# Patient Record
Sex: Female | Born: 1955
Health system: Southern US, Community
[De-identification: ages and names within clinical notes are randomized; demographics above are authoritative.]

## PROBLEM LIST (undated history)

## (undated) HISTORY — PX: CARPAL TUNNEL RELEASE: SHX101

## (undated) HISTORY — PX: OTHER SURGICAL HISTORY: SHX169

---

## 1988-01-20 HISTORY — PX: TUBAL LIGATION: SHX77

## 1998-01-10 ENCOUNTER — Other Ambulatory Visit: Admission: RE | Admit: 1998-01-10 | Discharge: 1998-01-10 | Payer: Self-pay | Admitting: Obstetrics and Gynecology

## 1999-04-11 ENCOUNTER — Other Ambulatory Visit: Admission: RE | Admit: 1999-04-11 | Discharge: 1999-04-11 | Payer: Self-pay | Admitting: Obstetrics and Gynecology

## 2000-06-18 ENCOUNTER — Other Ambulatory Visit: Admission: RE | Admit: 2000-06-18 | Discharge: 2000-06-18 | Payer: Self-pay | Admitting: Obstetrics and Gynecology

## 2000-07-12 ENCOUNTER — Encounter: Payer: Self-pay | Admitting: General Surgery

## 2000-07-12 ENCOUNTER — Ambulatory Visit (HOSPITAL_COMMUNITY): Admission: RE | Admit: 2000-07-12 | Discharge: 2000-07-12 | Payer: Self-pay | Admitting: General Surgery

## 2000-12-25 ENCOUNTER — Encounter: Payer: Self-pay | Admitting: Internal Medicine

## 2000-12-25 ENCOUNTER — Emergency Department (HOSPITAL_COMMUNITY): Admission: EM | Admit: 2000-12-25 | Discharge: 2000-12-25 | Payer: Self-pay | Admitting: Internal Medicine

## 2001-07-29 ENCOUNTER — Other Ambulatory Visit: Admission: RE | Admit: 2001-07-29 | Discharge: 2001-07-29 | Payer: Self-pay | Admitting: Obstetrics and Gynecology

## 2002-09-08 ENCOUNTER — Ambulatory Visit (HOSPITAL_COMMUNITY): Admission: RE | Admit: 2002-09-08 | Discharge: 2002-09-08 | Payer: Self-pay | Admitting: Obstetrics and Gynecology

## 2002-09-08 ENCOUNTER — Encounter: Payer: Self-pay | Admitting: Obstetrics and Gynecology

## 2002-09-15 ENCOUNTER — Other Ambulatory Visit: Admission: RE | Admit: 2002-09-15 | Discharge: 2002-09-15 | Payer: Self-pay | Admitting: Obstetrics and Gynecology

## 2003-08-21 ENCOUNTER — Emergency Department (HOSPITAL_COMMUNITY): Admission: EM | Admit: 2003-08-21 | Discharge: 2003-08-21 | Payer: Self-pay | Admitting: Emergency Medicine

## 2004-07-04 ENCOUNTER — Ambulatory Visit (HOSPITAL_COMMUNITY): Admission: RE | Admit: 2004-07-04 | Discharge: 2004-07-04 | Payer: Self-pay | Admitting: Obstetrics and Gynecology

## 2005-08-19 ENCOUNTER — Ambulatory Visit (HOSPITAL_COMMUNITY): Admission: RE | Admit: 2005-08-19 | Discharge: 2005-08-19 | Payer: Self-pay | Admitting: Obstetrics and Gynecology

## 2005-08-27 ENCOUNTER — Other Ambulatory Visit: Admission: RE | Admit: 2005-08-27 | Discharge: 2005-08-27 | Payer: Self-pay | Admitting: Obstetrics & Gynecology

## 2006-07-01 ENCOUNTER — Ambulatory Visit (HOSPITAL_BASED_OUTPATIENT_CLINIC_OR_DEPARTMENT_OTHER): Admission: RE | Admit: 2006-07-01 | Discharge: 2006-07-01 | Payer: Self-pay | Admitting: *Deleted

## 2006-10-22 ENCOUNTER — Ambulatory Visit (HOSPITAL_COMMUNITY): Admission: RE | Admit: 2006-10-22 | Discharge: 2006-10-22 | Payer: Self-pay | Admitting: Obstetrics and Gynecology

## 2008-02-17 ENCOUNTER — Ambulatory Visit (HOSPITAL_COMMUNITY): Admission: RE | Admit: 2008-02-17 | Discharge: 2008-02-17 | Payer: Self-pay | Admitting: Obstetrics and Gynecology

## 2008-03-09 ENCOUNTER — Other Ambulatory Visit: Admission: RE | Admit: 2008-03-09 | Discharge: 2008-03-09 | Payer: Self-pay | Admitting: Obstetrics and Gynecology

## 2008-06-29 ENCOUNTER — Emergency Department (HOSPITAL_COMMUNITY): Admission: EM | Admit: 2008-06-29 | Discharge: 2008-06-29 | Payer: Self-pay | Admitting: Emergency Medicine

## 2010-02-10 ENCOUNTER — Encounter: Payer: Self-pay | Admitting: Obstetrics and Gynecology

## 2010-06-03 NOTE — Op Note (Signed)
Kathleen Nelson, Kathleen Nelson            ACCOUNT NO.:  [id number]   MEDICAL RECORD NO.:  [id number]          PATIENT TYPE:  AMB   LOCATION:  DSC                          FACILITY:  [facility]   PHYSICIAN:  Lowell Bouton, M.D.DATE OF BIRTH:  18-Aug-1955   DATE OF PROCEDURE:  DATE OF DISCHARGE:                               OPERATIVE REPORT   Audio too short to transcribe (less than 5 seconds)      Lowell Bouton, M.D.     EMM/MEDQ  D:  07/01/2006  T:  07/01/2006  Job:  [id number]

## 2010-06-03 NOTE — Op Note (Signed)
NAMEEMMILIA, Kathleen Nelson            ACCOUNT NO.:  [id number]   MEDICAL RECORD NO.:  [id number]          PATIENT TYPE:  AMB   LOCATION:  DSC                          FACILITY:  [facility]   PHYSICIAN:  Lowell Bouton, M.D.DATE OF BIRTH:  1955/11/03   DATE OF PROCEDURE:  07/01/2006  DATE OF DISCHARGE:                               OPERATIVE REPORT   PREOP DIAGNOSIS:  Right carpal tunnel syndrome.   POSTOP DIAGNOSIS:  Right carpal tunnel syndrome.   PROCEDURE:  Decompression median nerve right carpal tunnel.   SURGEON:  Lowell Bouton, M.D.   ANESTHESIA:  Marcaine 1/2% local with sedation.   OPERATIVE FINDINGS:  The patient had no masses in the carpal canal.  The  motor branch of the nerve was intact.   DESCRIPTION OF PROCEDURE:  Under 1/2% Marcaine local anesthesia with a  tourniquet on the right arm, the right hand was prepped and draped in  the usual fashion after exsanguinating the limb.  The tourniquet was  inflated to 250 mmHg.  A 3 cm longitudinal incision was made in the palm  just ulnar to the thenar crease.  Sharp dissection was carried through  the subcutaneous tissues.  Blunt dissection was carried through the  superficial palmar fascia distal to the transverse carpal ligament.  A  hemostat was then placed in the carpal canal up against the hook of the  hamate; and the transverse carpal ligament was divided on the ulnar  border of the median nerve.  The proximal end of the ligament was  divided with the scissors, after dissecting the nerve away from the  undersurface of the ligament.  The carpal canal was then palpated; and  was found to be adequately decompressed. The nerve was examined and the  motor branch was identified.   The wound was irrigated with saline.  The skin was closed with 4-0 nylon  sutures.  Sterile dressings were applied followed by a volar wrist  splint.  The patient tolerated the procedure well and went to the  recovery room awake  and stable in good condition.      Lowell Bouton, M.D.  Electronically Signed     EMM/MEDQ  D:  07/01/2006  T:  07/01/2006  Job:  [id number]

## 2010-06-06 NOTE — Consult Note (Signed)
[facility]  Patient:    Kathleen Nelson, Kathleen Nelson Visit Number: [id number] MRN: [id number]          Service Type: EMS Location: ED Attending Physician:  Cassell Smiles. Dictated by:   Darreld Mclean, M.D. Admit Date:  12/25/2000 Discharge Date: 12/25/2000                            Consultation Report  The patient called me at home and asked that I see her.  HISTORY OF PRESENT ILLNESS:  Last night she tripped over her dog gate and felt a pain or a pop in her right ankle.  She was planning to go out to eat in [place].  Her son is a wait staff member at one of the local restaurants there.  She had severe pain.  She put ice on it.  She hobbled around and then went down to [place].  Once she got there, she noticed the pain was worse. She called me.  I told her to ice and elevate and I would be happy to see her in the emergency room.  Of course, I do not cover in [place].  She said she would see how it felt and if it did all right, she would call me this morning; otherwise she would call me.  This morning around 7:10, she called and said her ankle was still hurting.  I told her to come to the emergency room just as I did last night.  I met her here in the emergency room.  X-rays were taken. She has a fracture of the right ankle lateral malleolus, spiral type with no change in position, alignment or the mortise of the ankle.  No other injuries were sustained.  She has edema, ecchymosis of the right lateral ankle, decreased range of motion.  She is an Academic librarian stoic individual and does not complain of much pain.  Neurologically intact, neurologically intact.  No other injuries.  IMPRESSION:  Fracture lateral malleolus of the right ankle.  PLAN:  I put her in a posterior splint, prescription for Darvocet-N 100 for pain, crutches, ice, elevate.  I will see her in the office Monday morning. At that time if the swelling is down more, I will put her in  a regular short-leg cast.  Call for any difficulty during the weekend. Dictated by:   Darreld Mclean, M.D. Attending Physician:  Cassell Smiles DD:  12/25/00 TD:  12/25/00 Job: [id number]

## 2010-08-08 ENCOUNTER — Other Ambulatory Visit: Payer: Self-pay | Admitting: Obstetrics and Gynecology

## 2010-08-08 DIAGNOSIS — Z139 Encounter for screening, unspecified: Secondary | ICD-10-CM

## 2010-08-14 ENCOUNTER — Ambulatory Visit (HOSPITAL_COMMUNITY)
Admission: RE | Admit: 2010-08-14 | Discharge: 2010-08-14 | Disposition: A | Discharge status: Home / Self Care | Payer: BC Managed Care – PPO | Source: Ambulatory Visit | Attending: Obstetrics and Gynecology | Admitting: Obstetrics and Gynecology

## 2010-08-14 ENCOUNTER — Other Ambulatory Visit (HOSPITAL_COMMUNITY)
Admission: RE | Admit: 2010-08-14 | Discharge: 2010-08-14 | Disposition: A | Discharge status: Home / Self Care | Payer: BC Managed Care – PPO | Source: Ambulatory Visit | Attending: Obstetrics and Gynecology | Admitting: Obstetrics and Gynecology

## 2010-08-14 DIAGNOSIS — Z1231 Encounter for screening mammogram for malignant neoplasm of breast: Secondary | ICD-10-CM | POA: Insufficient documentation

## 2010-08-14 DIAGNOSIS — Z01419 Encounter for gynecological examination (general) (routine) without abnormal findings: Secondary | ICD-10-CM | POA: Insufficient documentation

## 2010-08-14 DIAGNOSIS — Z139 Encounter for screening, unspecified: Secondary | ICD-10-CM

## 2010-08-15 ENCOUNTER — Other Ambulatory Visit: Payer: Self-pay | Admitting: Adult Health

## 2010-11-06 LAB — POCT HEMOGLOBIN-HEMACUE
Hemoglobin: 12.6
Operator id: 123881

## 2012-04-04 ENCOUNTER — Other Ambulatory Visit: Payer: Self-pay | Admitting: Obstetrics and Gynecology

## 2012-04-04 DIAGNOSIS — Z139 Encounter for screening, unspecified: Secondary | ICD-10-CM

## 2012-04-05 ENCOUNTER — Ambulatory Visit (HOSPITAL_COMMUNITY)
Admission: RE | Admit: 2012-04-05 | Discharge: 2012-04-05 | Disposition: A | Discharge status: Home / Self Care | Payer: BC Managed Care – PPO | Source: Ambulatory Visit | Attending: Obstetrics and Gynecology | Admitting: Obstetrics and Gynecology

## 2012-04-05 DIAGNOSIS — Z139 Encounter for screening, unspecified: Secondary | ICD-10-CM

## 2012-04-05 DIAGNOSIS — Z1231 Encounter for screening mammogram for malignant neoplasm of breast: Secondary | ICD-10-CM | POA: Insufficient documentation

## 2012-04-08 ENCOUNTER — Other Ambulatory Visit: Payer: Self-pay | Admitting: Obstetrics and Gynecology

## 2012-04-08 ENCOUNTER — Encounter: Payer: Self-pay | Admitting: Obstetrics and Gynecology

## 2012-04-08 ENCOUNTER — Ambulatory Visit (INDEPENDENT_AMBULATORY_CARE_PROVIDER_SITE_OTHER): Payer: BC Managed Care – PPO | Admitting: Obstetrics and Gynecology

## 2012-04-08 ENCOUNTER — Other Ambulatory Visit (HOSPITAL_COMMUNITY)
Admission: RE | Admit: 2012-04-08 | Discharge: 2012-04-08 | Disposition: A | Discharge status: Home / Self Care | Payer: BC Managed Care – PPO | Source: Ambulatory Visit | Attending: Obstetrics and Gynecology | Admitting: Obstetrics and Gynecology

## 2012-04-08 VITALS — BP 130/84 | Ht 65.0 in | Wt 127.6 lb

## 2012-04-08 DIAGNOSIS — N951 Menopausal and female climacteric states: Secondary | ICD-10-CM

## 2012-04-08 DIAGNOSIS — Z01419 Encounter for gynecological examination (general) (routine) without abnormal findings: Secondary | ICD-10-CM

## 2012-04-08 DIAGNOSIS — Z1151 Encounter for screening for human papillomavirus (HPV): Secondary | ICD-10-CM | POA: Insufficient documentation

## 2012-04-08 DIAGNOSIS — Z1212 Encounter for screening for malignant neoplasm of rectum: Secondary | ICD-10-CM

## 2012-04-08 DIAGNOSIS — Z780 Asymptomatic menopausal state: Secondary | ICD-10-CM | POA: Insufficient documentation

## 2012-04-08 DIAGNOSIS — Z0000 Encounter for general adult medical examination without abnormal findings: Secondary | ICD-10-CM

## 2012-04-08 LAB — HEMOCCULT GUIAC POC 1CARD (OFFICE): Fecal Occult Blood, POC: NEGATIVE

## 2012-04-08 NOTE — Progress Notes (Signed)
Subjective:     Kathleen Nelson is a 57 y.o. female here for a routine exam.  Current complaints: vag dryness with intercourse.  Personal health questionnaire reviewed: yes.   Gynecologic History No LMP recorded. Patient is postmenopausal. Contraception: post menopausal status Last Pap: 2013. Results were: normal Last mammogram: 2013. Results were: normal  Obstetric History OB History   Grav Para Term Preterm Abortions TAB SAB Ect Mult Living                     Review of Systems Pertinent items are noted in HPI.    Objective:    BP 130/84  Ht 5\' 5"  (1.651 m)  Wt 127 lb 9.6 oz (57.879 kg)  BMI 21.23 kg/m2  General Appearance:    Alert, cooperative, no distress, appears stated age  Head:    Normocephalic, without obvious abnormality, atraumatic  Eyes:    PERRL, conjunctiva/corneas clear, EOM's intact, fundi    benign, both eyes  Ears:    Normal TM's and external ear canals, both ears  Nose:   Nares normal, septum midline, mucosa normal, no drainage    or sinus tenderness  Throat:   Lips, mucosa, and tongue normal; teeth and gums normal  Neck:   Supple, symmetrical, trachea midline, no adenopathy;    thyroid:  no enlargement/tenderness/nodules; no carotid   bruit or JVD  Back:     Symmetric, no curvature, ROM normal, no CVA tenderness  Lungs:     Clear to auscultation bilaterally, respirations unlabored  Chest Wall:    No tenderness or deformity   Heart:    Regular rate and rhythm, S1 and S2 normal, no murmur, rub   or gallop  Breast Exam:    No tenderness, masses, or nipple abnormality  Abdomen:     Soft, non-tender, bowel sounds active all four quadrants,    no masses, no organomegaly  Genitalia:    Normal female without lesion,+ atrophic tissues,no discharge or tenderness  Rectal:    Normal tone, normal prostate, no masses or tenderness;   guaiac negative stool  Extremities:   Extremities normal, atraumatic, no cyanosis or edema  Pulses:   2+ and symmetric all  extremities  Skin:   Skin color, texture, turgor normal, no rashes or lesions  Lymph nodes:   Cervical, supraclavicular, and axillary nodes normal  Neurologic:   CNII-XII intact, normal strength, sensation and reflexes    throughout      Assessment:    Healthy female exam.    Plan:    Education reviewed: safe sex/STD prevention and given info on [place] with Rx,  [place]. Follow up in: 1 year. Marland Kitchen

## 2012-04-08 NOTE — Patient Instructions (Signed)
Given info on [place], [employer], and [place]

## 2012-04-27 ENCOUNTER — Encounter: Payer: Self-pay | Admitting: Obstetrics and Gynecology

## 2012-04-28 ENCOUNTER — Encounter: Payer: Self-pay | Admitting: Obstetrics and Gynecology

## 2012-05-03 ENCOUNTER — Encounter: Payer: Self-pay | Admitting: Obstetrics and Gynecology

## 2013-03-29 ENCOUNTER — Other Ambulatory Visit: Payer: Self-pay | Admitting: Obstetrics and Gynecology

## 2013-03-29 DIAGNOSIS — Z1231 Encounter for screening mammogram for malignant neoplasm of breast: Secondary | ICD-10-CM

## 2013-04-06 ENCOUNTER — Ambulatory Visit (HOSPITAL_COMMUNITY)
Admission: RE | Admit: 2013-04-06 | Discharge: 2013-04-06 | Disposition: A | Discharge status: Home / Self Care | Payer: BC Managed Care – PPO | Source: Ambulatory Visit | Attending: Obstetrics and Gynecology | Admitting: Obstetrics and Gynecology

## 2013-04-06 DIAGNOSIS — Z1231 Encounter for screening mammogram for malignant neoplasm of breast: Secondary | ICD-10-CM | POA: Insufficient documentation

## 2013-04-10 ENCOUNTER — Other Ambulatory Visit: Payer: BC Managed Care – PPO | Admitting: Obstetrics & Gynecology

## 2013-04-11 ENCOUNTER — Other Ambulatory Visit: Payer: Self-pay | Admitting: Obstetrics and Gynecology

## 2013-04-11 DIAGNOSIS — R928 Other abnormal and inconclusive findings on diagnostic imaging of breast: Secondary | ICD-10-CM

## 2013-04-12 ENCOUNTER — Ambulatory Visit (INDEPENDENT_AMBULATORY_CARE_PROVIDER_SITE_OTHER): Payer: BC Managed Care – PPO | Admitting: Obstetrics and Gynecology

## 2013-04-12 ENCOUNTER — Encounter (INDEPENDENT_AMBULATORY_CARE_PROVIDER_SITE_OTHER): Payer: Self-pay

## 2013-04-12 ENCOUNTER — Ambulatory Visit (HOSPITAL_COMMUNITY)
Admission: RE | Admit: 2013-04-12 | Discharge: 2013-04-12 | Disposition: A | Discharge status: Home / Self Care | Payer: BC Managed Care – PPO | Source: Ambulatory Visit | Attending: Obstetrics and Gynecology | Admitting: Obstetrics and Gynecology

## 2013-04-12 ENCOUNTER — Encounter: Payer: Self-pay | Admitting: Obstetrics and Gynecology

## 2013-04-12 VITALS — BP 126/82 | Ht 65.0 in | Wt 129.0 lb

## 2013-04-12 DIAGNOSIS — R928 Other abnormal and inconclusive findings on diagnostic imaging of breast: Secondary | ICD-10-CM | POA: Insufficient documentation

## 2013-04-12 DIAGNOSIS — Z0000 Encounter for general adult medical examination without abnormal findings: Secondary | ICD-10-CM

## 2013-04-12 DIAGNOSIS — Z01419 Encounter for gynecological examination (general) (routine) without abnormal findings: Secondary | ICD-10-CM

## 2013-04-12 LAB — COMPREHENSIVE METABOLIC PANEL
ALBUMIN: 4.2 g/dL (ref 3.5–5.2)
ALK PHOS: 56 U/L (ref 39–117)
ALT: 21 U/L (ref 0–35)
AST: 23 U/L (ref 0–37)
BUN: 20 mg/dL (ref 6–23)
CALCIUM: 9.4 mg/dL (ref 8.4–10.5)
CHLORIDE: 107 meq/L (ref 96–112)
CO2: 29 meq/L (ref 19–32)
Creat: 0.67 mg/dL (ref 0.50–1.10)
GLUCOSE: 82 mg/dL (ref 70–99)
POTASSIUM: 4.1 meq/L (ref 3.5–5.3)
SODIUM: 142 meq/L (ref 135–145)
TOTAL PROTEIN: 6.2 g/dL (ref 6.0–8.3)
Total Bilirubin: 0.7 mg/dL (ref 0.2–1.2)

## 2013-04-12 LAB — LIPID PANEL
CHOL/HDL RATIO: 2.9 ratio
CHOLESTEROL: 156 mg/dL (ref 0–200)
HDL: 53 mg/dL (ref >39)
LDL Cholesterol: 89 mg/dL (ref 0–99)
Triglycerides: 70 mg/dL (ref <150)
VLDL: 14 mg/dL (ref 0–40)

## 2013-04-12 LAB — TSH: TSH: 1.045 u[IU]/mL (ref 0.350–4.500)

## 2013-04-12 NOTE — Progress Notes (Signed)
This chart was scribed by Bennett Scrapehristina Taylor, Medical Scribe, for Dr. Christin BachJohn Arlon Bleier on 04/12/13 at 10:22 AM. This chart was reviewed by Dr. Christin BachJohn Jabril Pursell and is accurate.  Assessment:  Annual Gyn Exam   Plan:  1. pap smear not done, next pap due in 2 years 2. return annually or prn 3    Annual mammogram reviewed, pt's questions answered to apparent satisfaction. Appt made for 1 PM today  4.   TSH and CMP 5.   Bone density scan Subjective:  Kathleen Nelson is a 58 y.o. female No obstetric history on file. who presents for annual exam. No LMP recorded. Patient is postmenopausal. The patient has no complaints today. + reports intermittent left breast pain in several spots  No PCP. Last blood work was done in 2008.  Last PAP done on 03/2012-NEGATIVE FOR INTRAEPITHELIAL LESIONS OR MALIGNANCY.  The following portions of the patient's history were reviewed and updated as appropriate: allergies, current medications, past family history, past medical history, past social history, past surgical history and problem list.  Review of Systems Constitutional: negative Gastrointestinal: + hemorrhoids, no BM problems Genitourinary: None  Objective:  BP 126/82  Ht 5\' 5"  (1.651 m)  Wt 129 lb (58.514 kg)  BMI 21.47 kg/m2   BMI: Body mass index is 21.47 kg/(m^2).  Chaperone present for exam which was performed with pt's permission General Appearance: Alert, appropriate appearance for age. No acute distress HEENT: Grossly normal Neck / Thyroid:  Cardiovascular: RRR; normal S1, S2, no murmur, HR 64 Lungs: CTA bilaterally Back: No CVAT Breast Exam: No dimpling, nipple retraction or discharge. No masses or nodes., Normal to inspection, Normal breast tissue bilaterally and No masses or nodes.No dimpling, nipple retraction or discharge. Gastrointestinal: Soft, non-tender, no masses or organomegaly Pelvic Exam: External genitalia: normal general appearance Urinary system: urethral meatus normal Vaginal:  atrophic mucosa Cervix: normal appearance Adnexa: normal bimanual exam Uterus: normal single, nontender Rectal: rectocele low Lymphatic Exam: Non-palpable nodes in neck, clavicular, axillary, or inguinal regions  Skin: no rash or abnormalities Neurologic: Normal gait and speech, no tremor  Psychiatric: Alert and oriented, appropriate affect.  Urinalysis:Not done  Christin BachJohn Nadia Viar. MD Pgr [phone] 10:28 AM

## 2014-01-26 ENCOUNTER — Other Ambulatory Visit: Payer: Self-pay | Admitting: Obstetrics and Gynecology

## 2014-01-26 DIAGNOSIS — Z1231 Encounter for screening mammogram for malignant neoplasm of breast: Secondary | ICD-10-CM

## 2014-04-09 ENCOUNTER — Ambulatory Visit (HOSPITAL_COMMUNITY)
Admission: RE | Admit: 2014-04-09 | Discharge: 2014-04-09 | Disposition: A | Discharge status: Home / Self Care | Payer: BLUE CROSS/BLUE SHIELD | Source: Ambulatory Visit | Attending: Obstetrics and Gynecology | Admitting: Obstetrics and Gynecology

## 2014-04-09 DIAGNOSIS — Z1231 Encounter for screening mammogram for malignant neoplasm of breast: Secondary | ICD-10-CM | POA: Diagnosis not present

## 2014-04-20 ENCOUNTER — Ambulatory Visit (INDEPENDENT_AMBULATORY_CARE_PROVIDER_SITE_OTHER): Payer: BLUE CROSS/BLUE SHIELD | Admitting: Obstetrics and Gynecology

## 2014-04-20 ENCOUNTER — Encounter: Payer: Self-pay | Admitting: Obstetrics and Gynecology

## 2014-04-20 VITALS — BP 120/80 | HR 60 | Ht 65.25 in | Wt 129.2 lb

## 2014-04-20 DIAGNOSIS — Z01419 Encounter for gynecological examination (general) (routine) without abnormal findings: Secondary | ICD-10-CM | POA: Diagnosis not present

## 2014-04-20 NOTE — Addendum Note (Signed)
Addended by: Richardson ChiquitoRAVIS, Vaibhav Fogleman M on: 04/20/2014 09:49 AM   Modules accepted: Orders

## 2014-04-20 NOTE — Progress Notes (Signed)
Patient ID: Kathleen Nelson Chisolm, female   DOB: October 11, 1955, 59 y.o.   MRN: [id number]  Assessment:  Annual Gyn Exam   Plan:  1. pap smear not  done, next pap due in 1 year 2. return annually or prn 3    Annual mammogram advised  Subjective:  Kathleen Nelson Lashomb is a 59 y.o. female No obstetric history on file. who presents for annual exam. No LMP recorded. Patient is postmenopausal.   The patient has no complaints today. She denies constipation or pain with BM. She denies bladder issues. Pt is getting remarried.   The following portions of the patient's history were reviewed and updated as appropriate: allergies, current medications, past family history, past medical history, past social history, past surgical history and problem list. History reviewed. No pertinent past medical history.  Past Surgical History  Procedure Laterality Date   Tubal ligation  1990   Carpal tunnel release Left      Current outpatient prescriptions:    aspirin 81 MG tablet, Take 81 mg by mouth daily., Disp: , Rfl:    Nutritional Supplements (PYCNOGENOL) 30 MG CAPS, Take 300 mg by mouth daily., Disp: , Rfl:    KRILL OIL PO, Take 1 capsule by mouth daily., Disp: , Rfl:   Review of Systems Constitutional: negative Gastrointestinal: negative Genitourinary: negative  Objective:  BP 120/80 mmHg   Pulse 60   Ht 5' 5.25" (1.657 m)   Wt 129 lb 3.2 oz (58.605 kg)   BMI 21.34 kg/m2   BMI: Body mass index is 21.34 kg/(m^2).  General Appearance: Alert, appropriate appearance for age. No acute distress HEENT: Grossly normal Neck / Thyroid:  Cardiovascular: RRR; normal S1, S2, no murmur Lungs: CTA bilaterally Back: No CVAT Breast Exam: No masses or nodes.No dimpling, nipple retraction or discharge. Gastrointestinal: Soft, non-tender, no masses or organomegaly Pelvic Exam: Vulva and vagina appear normal. Bimanual exam reveals normal uterus and adnexa. External genitalia: normal general appearance Vaginal: normal  mucosa without prolapse or lesions Cervix: normal appearance Adnexa: normal bimanual exam Uterus: normal single, nontender; well-supported Rectovaginal: not indicated; hemoccult negative Lymphatic Exam: Non-palpable nodes in neck, clavicular, axillary, or inguinal regions  Skin: no rash or abnormalities Neurologic: Normal gait and speech, no tremor  Psychiatric: Alert and oriented, appropriate affect.  Urinalysis:normal  Hemoccult negative  Christin BachJohn Ferguson. MD Pgr [phone] 9:31 AM     This chart was scribed for Tilda BurrowJohn Ferguson V, MD by Gwenyth Oberatherine Macek, ED Scribe. This patient was seen in room 1 and the patient's care was started at 9:30 AM.   I personally performed the services described in this documentation, which was SCRIBED in my presence. The recorded information has been reviewed and considered accurate. It has been edited as necessary during review. Tilda BurrowFERGUSON,JOHN V, MD

## 2015-05-02 ENCOUNTER — Other Ambulatory Visit: Payer: Self-pay | Admitting: Obstetrics and Gynecology

## 2015-05-02 DIAGNOSIS — Z1231 Encounter for screening mammogram for malignant neoplasm of breast: Secondary | ICD-10-CM

## 2015-05-06 ENCOUNTER — Ambulatory Visit (HOSPITAL_COMMUNITY)
Admission: RE | Admit: 2015-05-06 | Discharge: 2015-05-06 | Disposition: A | Discharge status: Home / Self Care | Payer: BLUE CROSS/BLUE SHIELD | Source: Ambulatory Visit | Attending: Obstetrics and Gynecology | Admitting: Obstetrics and Gynecology

## 2015-05-06 DIAGNOSIS — Z1231 Encounter for screening mammogram for malignant neoplasm of breast: Secondary | ICD-10-CM | POA: Insufficient documentation

## 2015-06-13 ENCOUNTER — Ambulatory Visit (INDEPENDENT_AMBULATORY_CARE_PROVIDER_SITE_OTHER): Payer: BLUE CROSS/BLUE SHIELD | Admitting: Obstetrics and Gynecology

## 2015-06-13 ENCOUNTER — Encounter: Payer: Self-pay | Admitting: Obstetrics and Gynecology

## 2015-06-13 ENCOUNTER — Other Ambulatory Visit (HOSPITAL_COMMUNITY)
Admission: RE | Admit: 2015-06-13 | Discharge: 2015-06-13 | Disposition: A | Discharge status: Home / Self Care | Payer: BLUE CROSS/BLUE SHIELD | Source: Ambulatory Visit | Attending: Obstetrics and Gynecology | Admitting: Obstetrics and Gynecology

## 2015-06-13 VITALS — Ht 65.0 in | Wt 130.0 lb

## 2015-06-13 DIAGNOSIS — Z1389 Encounter for screening for other disorder: Secondary | ICD-10-CM

## 2015-06-13 DIAGNOSIS — Z1151 Encounter for screening for human papillomavirus (HPV): Secondary | ICD-10-CM | POA: Insufficient documentation

## 2015-06-13 DIAGNOSIS — Z01419 Encounter for gynecological examination (general) (routine) without abnormal findings: Secondary | ICD-10-CM | POA: Diagnosis not present

## 2015-06-13 DIAGNOSIS — Z1211 Encounter for screening for malignant neoplasm of colon: Secondary | ICD-10-CM | POA: Insufficient documentation

## 2015-06-13 LAB — POCT URINALYSIS DIPSTICK
GLUCOSE UA: NEGATIVE
Ketones, UA: NEGATIVE
Leukocytes, UA: NEGATIVE
NITRITE UA: NEGATIVE
PROTEIN UA: NEGATIVE
RBC UA: NEGATIVE

## 2015-06-13 NOTE — Progress Notes (Signed)
Patient ID: Kathleen FosterCynthia Nelson, female   DOB: [birth year], 60 y.o.   MRN: [id number]  Assessment:  Annual Gyn Exam Pt advised to sign up for MyChart   Plan:  1. pap smear done, next pap due in 3 years 2. return annually or prn 3    Annual mammogram advised, pt UTD 4. Order fasting labs  Subjective:  Kathleen Nelson is a 60 y.o. female No obstetric history on file. who presents for annual exam. No LMP recorded. Patient is postmenopausal. The patient has no complaints today.   The following portions of the patient's history were reviewed and updated as appropriate: allergies, current medications, past family history, past medical history, past social history, past surgical history and problem list. History reviewed. No pertinent past medical history.  Past Surgical History  Procedure Laterality Date  . Tubal ligation  1990  . Carpal tunnel release Left      Current outpatient prescriptions:  .  Nutritional Supplements (PYCNOGENOL) 30 MG CAPS, Take 300 mg by mouth daily., Disp: , Rfl:   Review of Systems Constitutional: negative Gastrointestinal: negative Genitourinary: negative   Objective:  Ht 5\' 5"  (1.651 m)  Wt 130 lb (58.968 kg)  BMI 21.63 kg/m2   BMI: Body mass index is 21.63 kg/(m^2).  General Appearance: Alert, appropriate appearance for age. No acute distress HEENT: Grossly normal Neck / Thyroid:  Cardiovascular: RRR; normal S1, S2, no murmur Lungs: CTA bilaterally Back: No CVAT Breast Exam: No masses or nodes.No dimpling, nipple retraction or discharge. Gastrointestinal: Soft, non-tender, no masses or organomegaly Pelvic Exam: Vulva and vagina appear normal. Bimanual exam reveals normal uterus and adnexa. External genitalia: normal general appearance Vaginal: normal mucosa without prolapse or lesions and Good support. Post-menopausal tissue thinning.  Cervix: normal appearance Adnexa: normal bimanual exam Uterus: normal single, nontender  Rectovaginal: normal  rectal, no masses Lymphatic Exam: Non-palpable nodes in neck, clavicular, axillary, or inguinal regions  Skin: no rash or abnormalities Neurologic: Normal gait and speech, no tremor  Psychiatric: Alert and oriented, appropriate affect.  Urinalysis:normal  Guaiac negative   Christin BachJohn Shandon Burlingame. MD Pgr [phone] 9:44 AM     By signing my name below, I, Doreatha MartinEva Mathews, attest that this documentation has been prepared under the direction and in the presence of Tilda BurrowJohn Kerina Simoneau V, MD. Electronically Signed: Doreatha MartinEva Mathews, ED Scribe. 06/13/2015. 9:29 AM.  I personally performed the services described in this documentation, which was SCRIBED in my presence. The recorded information has been reviewed and considered accurate. It has been edited as necessary during review. Tilda BurrowFERGUSON,Hani Patnode V, MD

## 2015-06-18 LAB — CYTOLOGY - PAP

## 2015-06-19 ENCOUNTER — Telehealth: Payer: Self-pay | Admitting: *Deleted

## 2015-06-19 NOTE — Telephone Encounter (Signed)
Pt informed of WNL pap results from 06/13/2015.

## 2015-10-16 ENCOUNTER — Other Ambulatory Visit: Payer: Self-pay | Admitting: Advanced Practice Midwife

## 2015-10-16 ENCOUNTER — Encounter: Payer: Self-pay | Admitting: Advanced Practice Midwife

## 2015-10-16 DIAGNOSIS — N63 Unspecified lump in unspecified breast: Secondary | ICD-10-CM

## 2015-10-17 ENCOUNTER — Ambulatory Visit: Payer: BLUE CROSS/BLUE SHIELD | Admitting: Advanced Practice Midwife

## 2015-10-18 ENCOUNTER — Ambulatory Visit
Admission: RE | Admit: 2015-10-18 | Discharge: 2015-10-18 | Disposition: A | Discharge status: Home / Self Care | Payer: BLUE CROSS/BLUE SHIELD | Source: Ambulatory Visit | Attending: Advanced Practice Midwife | Admitting: Advanced Practice Midwife

## 2015-10-18 DIAGNOSIS — N63 Unspecified lump in unspecified breast: Secondary | ICD-10-CM

## 2015-12-23 DIAGNOSIS — M9903 Segmental and somatic dysfunction of lumbar region: Secondary | ICD-10-CM | POA: Diagnosis not present

## 2015-12-23 DIAGNOSIS — M5136 Other intervertebral disc degeneration, lumbar region: Secondary | ICD-10-CM | POA: Diagnosis not present

## 2016-01-01 DIAGNOSIS — M5136 Other intervertebral disc degeneration, lumbar region: Secondary | ICD-10-CM | POA: Diagnosis not present

## 2016-01-01 DIAGNOSIS — M9903 Segmental and somatic dysfunction of lumbar region: Secondary | ICD-10-CM | POA: Diagnosis not present

## 2016-01-02 DIAGNOSIS — L570 Actinic keratosis: Secondary | ICD-10-CM | POA: Diagnosis not present

## 2016-01-30 DIAGNOSIS — M9903 Segmental and somatic dysfunction of lumbar region: Secondary | ICD-10-CM | POA: Diagnosis not present

## 2016-01-30 DIAGNOSIS — M5136 Other intervertebral disc degeneration, lumbar region: Secondary | ICD-10-CM | POA: Diagnosis not present

## 2016-02-13 DIAGNOSIS — M9903 Segmental and somatic dysfunction of lumbar region: Secondary | ICD-10-CM | POA: Diagnosis not present

## 2016-02-13 DIAGNOSIS — M5136 Other intervertebral disc degeneration, lumbar region: Secondary | ICD-10-CM | POA: Diagnosis not present

## 2016-02-26 DIAGNOSIS — M9903 Segmental and somatic dysfunction of lumbar region: Secondary | ICD-10-CM | POA: Diagnosis not present

## 2016-02-26 DIAGNOSIS — M5136 Other intervertebral disc degeneration, lumbar region: Secondary | ICD-10-CM | POA: Diagnosis not present

## 2016-03-04 DIAGNOSIS — M5136 Other intervertebral disc degeneration, lumbar region: Secondary | ICD-10-CM | POA: Diagnosis not present

## 2016-03-04 DIAGNOSIS — M9903 Segmental and somatic dysfunction of lumbar region: Secondary | ICD-10-CM | POA: Diagnosis not present

## 2016-03-12 DIAGNOSIS — M5136 Other intervertebral disc degeneration, lumbar region: Secondary | ICD-10-CM | POA: Diagnosis not present

## 2016-03-12 DIAGNOSIS — M9903 Segmental and somatic dysfunction of lumbar region: Secondary | ICD-10-CM | POA: Diagnosis not present

## 2016-03-18 DIAGNOSIS — M5136 Other intervertebral disc degeneration, lumbar region: Secondary | ICD-10-CM | POA: Diagnosis not present

## 2016-03-18 DIAGNOSIS — M9903 Segmental and somatic dysfunction of lumbar region: Secondary | ICD-10-CM | POA: Diagnosis not present

## 2016-04-06 DIAGNOSIS — M9903 Segmental and somatic dysfunction of lumbar region: Secondary | ICD-10-CM | POA: Diagnosis not present

## 2016-04-06 DIAGNOSIS — M5136 Other intervertebral disc degeneration, lumbar region: Secondary | ICD-10-CM | POA: Diagnosis not present

## 2016-04-13 DIAGNOSIS — M9903 Segmental and somatic dysfunction of lumbar region: Secondary | ICD-10-CM | POA: Diagnosis not present

## 2016-04-13 DIAGNOSIS — M5136 Other intervertebral disc degeneration, lumbar region: Secondary | ICD-10-CM | POA: Diagnosis not present

## 2016-04-15 DIAGNOSIS — M5136 Other intervertebral disc degeneration, lumbar region: Secondary | ICD-10-CM | POA: Diagnosis not present

## 2016-04-15 DIAGNOSIS — M9903 Segmental and somatic dysfunction of lumbar region: Secondary | ICD-10-CM | POA: Diagnosis not present

## 2016-04-21 DIAGNOSIS — M9903 Segmental and somatic dysfunction of lumbar region: Secondary | ICD-10-CM | POA: Diagnosis not present

## 2016-04-21 DIAGNOSIS — M5136 Other intervertebral disc degeneration, lumbar region: Secondary | ICD-10-CM | POA: Diagnosis not present

## 2016-04-24 ENCOUNTER — Telehealth: Payer: Self-pay | Admitting: Advanced Practice Midwife

## 2016-04-28 DIAGNOSIS — M5136 Other intervertebral disc degeneration, lumbar region: Secondary | ICD-10-CM | POA: Diagnosis not present

## 2016-04-28 DIAGNOSIS — M9903 Segmental and somatic dysfunction of lumbar region: Secondary | ICD-10-CM | POA: Diagnosis not present

## 2016-04-28 NOTE — Telephone Encounter (Signed)
Either way is OK (only looked at left)

## 2016-05-07 DIAGNOSIS — M5136 Other intervertebral disc degeneration, lumbar region: Secondary | ICD-10-CM | POA: Diagnosis not present

## 2016-05-07 DIAGNOSIS — M9903 Segmental and somatic dysfunction of lumbar region: Secondary | ICD-10-CM | POA: Diagnosis not present

## 2016-05-11 DIAGNOSIS — M9903 Segmental and somatic dysfunction of lumbar region: Secondary | ICD-10-CM | POA: Diagnosis not present

## 2016-05-11 DIAGNOSIS — M5136 Other intervertebral disc degeneration, lumbar region: Secondary | ICD-10-CM | POA: Diagnosis not present

## 2016-05-19 DIAGNOSIS — M5136 Other intervertebral disc degeneration, lumbar region: Secondary | ICD-10-CM | POA: Diagnosis not present

## 2016-05-19 DIAGNOSIS — M9903 Segmental and somatic dysfunction of lumbar region: Secondary | ICD-10-CM | POA: Diagnosis not present

## 2016-05-26 DIAGNOSIS — M9903 Segmental and somatic dysfunction of lumbar region: Secondary | ICD-10-CM | POA: Diagnosis not present

## 2016-05-26 DIAGNOSIS — M5136 Other intervertebral disc degeneration, lumbar region: Secondary | ICD-10-CM | POA: Diagnosis not present

## 2016-06-04 DIAGNOSIS — M79641 Pain in right hand: Secondary | ICD-10-CM | POA: Diagnosis not present

## 2016-06-04 DIAGNOSIS — M25541 Pain in joints of right hand: Secondary | ICD-10-CM | POA: Diagnosis not present

## 2016-06-04 DIAGNOSIS — M1811 Unilateral primary osteoarthritis of first carpometacarpal joint, right hand: Secondary | ICD-10-CM | POA: Diagnosis not present

## 2016-06-08 DIAGNOSIS — M5136 Other intervertebral disc degeneration, lumbar region: Secondary | ICD-10-CM | POA: Diagnosis not present

## 2016-06-08 DIAGNOSIS — M9903 Segmental and somatic dysfunction of lumbar region: Secondary | ICD-10-CM | POA: Diagnosis not present

## 2016-06-10 DIAGNOSIS — M5136 Other intervertebral disc degeneration, lumbar region: Secondary | ICD-10-CM | POA: Diagnosis not present

## 2016-06-10 DIAGNOSIS — M9903 Segmental and somatic dysfunction of lumbar region: Secondary | ICD-10-CM | POA: Diagnosis not present

## 2016-06-16 DIAGNOSIS — M5136 Other intervertebral disc degeneration, lumbar region: Secondary | ICD-10-CM | POA: Diagnosis not present

## 2016-06-16 DIAGNOSIS — M9903 Segmental and somatic dysfunction of lumbar region: Secondary | ICD-10-CM | POA: Diagnosis not present

## 2016-06-18 ENCOUNTER — Other Ambulatory Visit: Payer: BLUE CROSS/BLUE SHIELD | Admitting: Obstetrics and Gynecology

## 2016-06-23 DIAGNOSIS — M5136 Other intervertebral disc degeneration, lumbar region: Secondary | ICD-10-CM | POA: Diagnosis not present

## 2016-06-23 DIAGNOSIS — M9903 Segmental and somatic dysfunction of lumbar region: Secondary | ICD-10-CM | POA: Diagnosis not present

## 2016-06-25 ENCOUNTER — Encounter: Payer: Self-pay | Admitting: Obstetrics and Gynecology

## 2016-06-25 ENCOUNTER — Ambulatory Visit (INDEPENDENT_AMBULATORY_CARE_PROVIDER_SITE_OTHER): Payer: BLUE CROSS/BLUE SHIELD | Admitting: Obstetrics and Gynecology

## 2016-06-25 VITALS — BP 106/58 | HR 68 | Ht 65.5 in | Wt 127.0 lb

## 2016-06-25 DIAGNOSIS — M9903 Segmental and somatic dysfunction of lumbar region: Secondary | ICD-10-CM | POA: Diagnosis not present

## 2016-06-25 DIAGNOSIS — Z01419 Encounter for gynecological examination (general) (routine) without abnormal findings: Secondary | ICD-10-CM | POA: Diagnosis not present

## 2016-06-25 DIAGNOSIS — Z1389 Encounter for screening for other disorder: Secondary | ICD-10-CM

## 2016-06-25 DIAGNOSIS — M5136 Other intervertebral disc degeneration, lumbar region: Secondary | ICD-10-CM | POA: Diagnosis not present

## 2016-06-25 LAB — POCT URINALYSIS DIPSTICK
Blood, UA: NEGATIVE
GLUCOSE UA: NEGATIVE
LEUKOCYTES UA: NEGATIVE
NITRITE UA: NEGATIVE
Protein, UA: NEGATIVE

## 2016-06-25 NOTE — Progress Notes (Signed)
Patient ID: Kathleen FosterCynthia Merkin, female   DOB: 1955/02/24, 61 y.o.   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 pap smear not done  2. return annually or prn 3    Annual mammogram and regular self exams advised 4. Contact Dr. Darrick PennaFields about banding  5. Recommended bone density scan   Subjective:   Chief Complaint  Patient presents with  . Annual Exam    wants urine checked     Kathleen FosterCynthia Ambriz is a 61 y.o. female No obstetric history on file. who presents for annual exam. No LMP recorded. Patient is postmenopausal. The patient requests that we find out if Fields still does hemorrhoid banding as her hemorrhoids have become bothersome. She denies SUI. She reports occasional difficulty defecating, for which she is able to use splinting with adequate relief.   The following portions of the patient's history were reviewed and updated as appropriate: allergies, current medications, past family history, past medical history, past social history, past surgical history and problem list. History reviewed. No pertinent past medical history.  Past Surgical History:  Procedure Laterality Date  . CARPAL TUNNEL RELEASE Left   . TUBAL LIGATION  1990     Current Outpatient Prescriptions:  .  Cholecalciferol (VITAMIN D PO), Take by mouth., Disp: , Rfl:  .  Nutritional Supplements (PYCNOGENOL) 30 MG CAPS, Take 300 mg by mouth daily., Disp: , Rfl:   Review of Systems Constitutional: negative Gastrointestinal: negative Genitourinary: negative   Objective:  BP (!) 106/58 (BP Location: Right Arm, Patient Position: Sitting, Cuff Size: Normal)   Pulse 68   Ht 5' 5.5" (1.664 m)   Wt 127 lb (57.6 kg)   BMI 20.81 kg/m    BMI: Body mass index is 20.81 kg/m.  General Appearance: Alert, appropriate appearance for age. No acute distress HEENT: Grossly normal Neck / Thyroid:  Cardiovascular: RRR; normal S1,  S2, no murmur Lungs: CTA bilaterally Back: No CVAT Breast Exam: No masses or nodes.No dimpling, nipple retraction or discharge. Gastrointestinal: Soft, non-tender, no masses or organomegaly Pelvic Exam:  External genitalia: normal general appearance Vaginal: normal mucosa without prolapse or lesions, good posterior support  Cervix: normal appearance and standard PAP obtained Adnexa: normal bimanual exam Uterus: normal single, nontender, 1st degree uterine descensus  Rectovaginal: rectocele and guaiac negative stool obtained Lymphatic Exam: Non-palpable nodes in neck, clavicular, axillary, or inguinal regions  Skin: no rash or abnormalities Neurologic: Normal gait and speech, no tremor  Psychiatric: Alert and oriented, appropriate affect.  Urinalysis:ketones  Guaiac negative   Christin BachJohn Janne Faulk. MD Pgr [phone] 3:33 PM    By signing my name below, I, Doreatha MartinEva Mathews, attest that this documentation has been prepared under the direction and in the presence of Tilda BurrowFerguson, Floyd Lusignan V, MD. Electronically Signed: Doreatha MartinEva Mathews, ED Scribe. 06/25/16. 3:29 PM.  I personally performed the services described in this documentation, which was SCRIBED in my presence. The recorded information has been reviewed and considered accurate. It has been edited as necessary during review. Tilda BurrowFERGUSON,Tevion Laforge V, MD

## 2016-06-26 ENCOUNTER — Encounter: Payer: Self-pay | Admitting: Gastroenterology

## 2016-06-30 DIAGNOSIS — M9903 Segmental and somatic dysfunction of lumbar region: Secondary | ICD-10-CM | POA: Diagnosis not present

## 2016-06-30 DIAGNOSIS — M5136 Other intervertebral disc degeneration, lumbar region: Secondary | ICD-10-CM | POA: Diagnosis not present

## 2016-07-08 DIAGNOSIS — M9903 Segmental and somatic dysfunction of lumbar region: Secondary | ICD-10-CM | POA: Diagnosis not present

## 2016-07-08 DIAGNOSIS — M5136 Other intervertebral disc degeneration, lumbar region: Secondary | ICD-10-CM | POA: Diagnosis not present

## 2016-07-14 DIAGNOSIS — M9903 Segmental and somatic dysfunction of lumbar region: Secondary | ICD-10-CM | POA: Diagnosis not present

## 2016-07-14 DIAGNOSIS — M5136 Other intervertebral disc degeneration, lumbar region: Secondary | ICD-10-CM | POA: Diagnosis not present

## 2016-07-16 DIAGNOSIS — M5136 Other intervertebral disc degeneration, lumbar region: Secondary | ICD-10-CM | POA: Diagnosis not present

## 2016-07-16 DIAGNOSIS — M9903 Segmental and somatic dysfunction of lumbar region: Secondary | ICD-10-CM | POA: Diagnosis not present

## 2016-07-27 DIAGNOSIS — M9903 Segmental and somatic dysfunction of lumbar region: Secondary | ICD-10-CM | POA: Diagnosis not present

## 2016-07-27 DIAGNOSIS — M5136 Other intervertebral disc degeneration, lumbar region: Secondary | ICD-10-CM | POA: Diagnosis not present

## 2016-07-30 DIAGNOSIS — L570 Actinic keratosis: Secondary | ICD-10-CM | POA: Diagnosis not present

## 2016-08-04 DIAGNOSIS — M9903 Segmental and somatic dysfunction of lumbar region: Secondary | ICD-10-CM | POA: Diagnosis not present

## 2016-08-04 DIAGNOSIS — M5136 Other intervertebral disc degeneration, lumbar region: Secondary | ICD-10-CM | POA: Diagnosis not present

## 2016-08-07 DIAGNOSIS — D123 Benign neoplasm of transverse colon: Secondary | ICD-10-CM | POA: Diagnosis not present

## 2016-08-07 DIAGNOSIS — Z1211 Encounter for screening for malignant neoplasm of colon: Secondary | ICD-10-CM | POA: Diagnosis not present

## 2016-08-11 DIAGNOSIS — M5136 Other intervertebral disc degeneration, lumbar region: Secondary | ICD-10-CM | POA: Diagnosis not present

## 2016-08-11 DIAGNOSIS — D123 Benign neoplasm of transverse colon: Secondary | ICD-10-CM | POA: Diagnosis not present

## 2016-08-11 DIAGNOSIS — M9903 Segmental and somatic dysfunction of lumbar region: Secondary | ICD-10-CM | POA: Diagnosis not present

## 2016-08-13 DIAGNOSIS — M5136 Other intervertebral disc degeneration, lumbar region: Secondary | ICD-10-CM | POA: Diagnosis not present

## 2016-08-13 DIAGNOSIS — M9903 Segmental and somatic dysfunction of lumbar region: Secondary | ICD-10-CM | POA: Diagnosis not present

## 2016-08-17 DIAGNOSIS — M5136 Other intervertebral disc degeneration, lumbar region: Secondary | ICD-10-CM | POA: Diagnosis not present

## 2016-08-17 DIAGNOSIS — M9903 Segmental and somatic dysfunction of lumbar region: Secondary | ICD-10-CM | POA: Diagnosis not present

## 2016-08-24 DIAGNOSIS — M9903 Segmental and somatic dysfunction of lumbar region: Secondary | ICD-10-CM | POA: Diagnosis not present

## 2016-08-24 DIAGNOSIS — M5136 Other intervertebral disc degeneration, lumbar region: Secondary | ICD-10-CM | POA: Diagnosis not present

## 2016-09-01 DIAGNOSIS — M9903 Segmental and somatic dysfunction of lumbar region: Secondary | ICD-10-CM | POA: Diagnosis not present

## 2016-09-01 DIAGNOSIS — M5136 Other intervertebral disc degeneration, lumbar region: Secondary | ICD-10-CM | POA: Diagnosis not present

## 2016-09-08 ENCOUNTER — Other Ambulatory Visit: Payer: Self-pay | Admitting: *Deleted

## 2016-09-08 DIAGNOSIS — Z780 Asymptomatic menopausal state: Secondary | ICD-10-CM

## 2016-09-10 ENCOUNTER — Other Ambulatory Visit: Payer: Self-pay | Admitting: Obstetrics and Gynecology

## 2016-09-10 ENCOUNTER — Other Ambulatory Visit (HOSPITAL_COMMUNITY): Payer: BLUE CROSS/BLUE SHIELD

## 2016-09-10 DIAGNOSIS — Z1231 Encounter for screening mammogram for malignant neoplasm of breast: Secondary | ICD-10-CM

## 2016-09-10 DIAGNOSIS — M9903 Segmental and somatic dysfunction of lumbar region: Secondary | ICD-10-CM | POA: Diagnosis not present

## 2016-09-10 DIAGNOSIS — M5136 Other intervertebral disc degeneration, lumbar region: Secondary | ICD-10-CM | POA: Diagnosis not present

## 2016-09-14 DIAGNOSIS — M9903 Segmental and somatic dysfunction of lumbar region: Secondary | ICD-10-CM | POA: Diagnosis not present

## 2016-09-14 DIAGNOSIS — M5136 Other intervertebral disc degeneration, lumbar region: Secondary | ICD-10-CM | POA: Diagnosis not present

## 2016-09-17 ENCOUNTER — Ambulatory Visit (HOSPITAL_COMMUNITY)
Admission: RE | Admit: 2016-09-17 | Discharge: 2016-09-17 | Disposition: A | Discharge status: Home / Self Care | Payer: BLUE CROSS/BLUE SHIELD | Source: Ambulatory Visit | Attending: Obstetrics and Gynecology | Admitting: Obstetrics and Gynecology

## 2016-09-17 ENCOUNTER — Ambulatory Visit (HOSPITAL_COMMUNITY): Payer: BLUE CROSS/BLUE SHIELD

## 2016-09-17 DIAGNOSIS — Z1231 Encounter for screening mammogram for malignant neoplasm of breast: Secondary | ICD-10-CM

## 2016-09-17 DIAGNOSIS — M8589 Other specified disorders of bone density and structure, multiple sites: Secondary | ICD-10-CM | POA: Insufficient documentation

## 2016-09-17 DIAGNOSIS — Z780 Asymptomatic menopausal state: Secondary | ICD-10-CM | POA: Diagnosis not present

## 2016-09-23 DIAGNOSIS — M9903 Segmental and somatic dysfunction of lumbar region: Secondary | ICD-10-CM | POA: Diagnosis not present

## 2016-09-23 DIAGNOSIS — M5136 Other intervertebral disc degeneration, lumbar region: Secondary | ICD-10-CM | POA: Diagnosis not present

## 2016-09-28 DIAGNOSIS — M5136 Other intervertebral disc degeneration, lumbar region: Secondary | ICD-10-CM | POA: Diagnosis not present

## 2016-09-28 DIAGNOSIS — M9903 Segmental and somatic dysfunction of lumbar region: Secondary | ICD-10-CM | POA: Diagnosis not present

## 2016-10-05 DIAGNOSIS — M9903 Segmental and somatic dysfunction of lumbar region: Secondary | ICD-10-CM | POA: Diagnosis not present

## 2016-10-05 DIAGNOSIS — M5136 Other intervertebral disc degeneration, lumbar region: Secondary | ICD-10-CM | POA: Diagnosis not present

## 2016-10-12 DIAGNOSIS — M5136 Other intervertebral disc degeneration, lumbar region: Secondary | ICD-10-CM | POA: Diagnosis not present

## 2016-10-12 DIAGNOSIS — M9903 Segmental and somatic dysfunction of lumbar region: Secondary | ICD-10-CM | POA: Diagnosis not present

## 2016-10-15 DIAGNOSIS — H524 Presbyopia: Secondary | ICD-10-CM | POA: Diagnosis not present

## 2016-10-15 DIAGNOSIS — M5136 Other intervertebral disc degeneration, lumbar region: Secondary | ICD-10-CM | POA: Diagnosis not present

## 2016-10-15 DIAGNOSIS — H52223 Regular astigmatism, bilateral: Secondary | ICD-10-CM | POA: Diagnosis not present

## 2016-10-15 DIAGNOSIS — H5203 Hypermetropia, bilateral: Secondary | ICD-10-CM | POA: Diagnosis not present

## 2016-10-15 DIAGNOSIS — M9903 Segmental and somatic dysfunction of lumbar region: Secondary | ICD-10-CM | POA: Diagnosis not present

## 2016-10-19 DIAGNOSIS — M9903 Segmental and somatic dysfunction of lumbar region: Secondary | ICD-10-CM | POA: Diagnosis not present

## 2016-10-19 DIAGNOSIS — M5136 Other intervertebral disc degeneration, lumbar region: Secondary | ICD-10-CM | POA: Diagnosis not present

## 2016-10-22 DIAGNOSIS — M5136 Other intervertebral disc degeneration, lumbar region: Secondary | ICD-10-CM | POA: Diagnosis not present

## 2016-10-22 DIAGNOSIS — M9903 Segmental and somatic dysfunction of lumbar region: Secondary | ICD-10-CM | POA: Diagnosis not present

## 2016-10-26 DIAGNOSIS — M9903 Segmental and somatic dysfunction of lumbar region: Secondary | ICD-10-CM | POA: Diagnosis not present

## 2016-10-26 DIAGNOSIS — M5136 Other intervertebral disc degeneration, lumbar region: Secondary | ICD-10-CM | POA: Diagnosis not present

## 2016-11-02 DIAGNOSIS — M9903 Segmental and somatic dysfunction of lumbar region: Secondary | ICD-10-CM | POA: Diagnosis not present

## 2016-11-02 DIAGNOSIS — M5136 Other intervertebral disc degeneration, lumbar region: Secondary | ICD-10-CM | POA: Diagnosis not present

## 2016-11-09 DIAGNOSIS — M5136 Other intervertebral disc degeneration, lumbar region: Secondary | ICD-10-CM | POA: Diagnosis not present

## 2016-11-09 DIAGNOSIS — M9903 Segmental and somatic dysfunction of lumbar region: Secondary | ICD-10-CM | POA: Diagnosis not present

## 2016-11-16 DIAGNOSIS — M5136 Other intervertebral disc degeneration, lumbar region: Secondary | ICD-10-CM | POA: Diagnosis not present

## 2016-11-16 DIAGNOSIS — M9903 Segmental and somatic dysfunction of lumbar region: Secondary | ICD-10-CM | POA: Diagnosis not present

## 2016-11-18 DIAGNOSIS — M5136 Other intervertebral disc degeneration, lumbar region: Secondary | ICD-10-CM | POA: Diagnosis not present

## 2016-11-18 DIAGNOSIS — M9903 Segmental and somatic dysfunction of lumbar region: Secondary | ICD-10-CM | POA: Diagnosis not present

## 2016-11-25 DIAGNOSIS — M9903 Segmental and somatic dysfunction of lumbar region: Secondary | ICD-10-CM | POA: Diagnosis not present

## 2016-11-25 DIAGNOSIS — M5136 Other intervertebral disc degeneration, lumbar region: Secondary | ICD-10-CM | POA: Diagnosis not present

## 2016-12-07 DIAGNOSIS — M5136 Other intervertebral disc degeneration, lumbar region: Secondary | ICD-10-CM | POA: Diagnosis not present

## 2016-12-07 DIAGNOSIS — M9903 Segmental and somatic dysfunction of lumbar region: Secondary | ICD-10-CM | POA: Diagnosis not present

## 2016-12-14 DIAGNOSIS — M9903 Segmental and somatic dysfunction of lumbar region: Secondary | ICD-10-CM | POA: Diagnosis not present

## 2016-12-14 DIAGNOSIS — M5136 Other intervertebral disc degeneration, lumbar region: Secondary | ICD-10-CM | POA: Diagnosis not present

## 2016-12-22 DIAGNOSIS — M9903 Segmental and somatic dysfunction of lumbar region: Secondary | ICD-10-CM | POA: Diagnosis not present

## 2016-12-22 DIAGNOSIS — M5136 Other intervertebral disc degeneration, lumbar region: Secondary | ICD-10-CM | POA: Diagnosis not present

## 2016-12-31 DIAGNOSIS — M5136 Other intervertebral disc degeneration, lumbar region: Secondary | ICD-10-CM | POA: Diagnosis not present

## 2016-12-31 DIAGNOSIS — M9903 Segmental and somatic dysfunction of lumbar region: Secondary | ICD-10-CM | POA: Diagnosis not present

## 2017-01-04 DIAGNOSIS — M9903 Segmental and somatic dysfunction of lumbar region: Secondary | ICD-10-CM | POA: Diagnosis not present

## 2017-01-04 DIAGNOSIS — M5136 Other intervertebral disc degeneration, lumbar region: Secondary | ICD-10-CM | POA: Diagnosis not present

## 2017-01-18 DIAGNOSIS — M9903 Segmental and somatic dysfunction of lumbar region: Secondary | ICD-10-CM | POA: Diagnosis not present

## 2017-01-18 DIAGNOSIS — M5136 Other intervertebral disc degeneration, lumbar region: Secondary | ICD-10-CM | POA: Diagnosis not present

## 2017-01-25 DIAGNOSIS — M9903 Segmental and somatic dysfunction of lumbar region: Secondary | ICD-10-CM | POA: Diagnosis not present

## 2017-01-25 DIAGNOSIS — M5136 Other intervertebral disc degeneration, lumbar region: Secondary | ICD-10-CM | POA: Diagnosis not present

## 2017-01-28 DIAGNOSIS — M5136 Other intervertebral disc degeneration, lumbar region: Secondary | ICD-10-CM | POA: Diagnosis not present

## 2017-01-28 DIAGNOSIS — M9903 Segmental and somatic dysfunction of lumbar region: Secondary | ICD-10-CM | POA: Diagnosis not present

## 2017-02-01 DIAGNOSIS — M5136 Other intervertebral disc degeneration, lumbar region: Secondary | ICD-10-CM | POA: Diagnosis not present

## 2017-02-01 DIAGNOSIS — M9903 Segmental and somatic dysfunction of lumbar region: Secondary | ICD-10-CM | POA: Diagnosis not present

## 2017-02-08 DIAGNOSIS — M5136 Other intervertebral disc degeneration, lumbar region: Secondary | ICD-10-CM | POA: Diagnosis not present

## 2017-02-08 DIAGNOSIS — M9903 Segmental and somatic dysfunction of lumbar region: Secondary | ICD-10-CM | POA: Diagnosis not present

## 2017-02-09 DIAGNOSIS — M9903 Segmental and somatic dysfunction of lumbar region: Secondary | ICD-10-CM | POA: Diagnosis not present

## 2017-02-09 DIAGNOSIS — M5136 Other intervertebral disc degeneration, lumbar region: Secondary | ICD-10-CM | POA: Diagnosis not present

## 2017-02-11 DIAGNOSIS — M5136 Other intervertebral disc degeneration, lumbar region: Secondary | ICD-10-CM | POA: Diagnosis not present

## 2017-02-11 DIAGNOSIS — M9903 Segmental and somatic dysfunction of lumbar region: Secondary | ICD-10-CM | POA: Diagnosis not present

## 2017-02-15 DIAGNOSIS — M5136 Other intervertebral disc degeneration, lumbar region: Secondary | ICD-10-CM | POA: Diagnosis not present

## 2017-02-15 DIAGNOSIS — M9903 Segmental and somatic dysfunction of lumbar region: Secondary | ICD-10-CM | POA: Diagnosis not present

## 2017-02-18 DIAGNOSIS — L819 Disorder of pigmentation, unspecified: Secondary | ICD-10-CM | POA: Diagnosis not present

## 2017-02-18 DIAGNOSIS — L570 Actinic keratosis: Secondary | ICD-10-CM | POA: Diagnosis not present

## 2017-02-18 DIAGNOSIS — D485 Neoplasm of uncertain behavior of skin: Secondary | ICD-10-CM | POA: Diagnosis not present

## 2017-02-22 DIAGNOSIS — M5136 Other intervertebral disc degeneration, lumbar region: Secondary | ICD-10-CM | POA: Diagnosis not present

## 2017-02-22 DIAGNOSIS — M9903 Segmental and somatic dysfunction of lumbar region: Secondary | ICD-10-CM | POA: Diagnosis not present

## 2017-02-25 DIAGNOSIS — M5136 Other intervertebral disc degeneration, lumbar region: Secondary | ICD-10-CM | POA: Diagnosis not present

## 2017-02-25 DIAGNOSIS — M9903 Segmental and somatic dysfunction of lumbar region: Secondary | ICD-10-CM | POA: Diagnosis not present

## 2017-03-01 DIAGNOSIS — M5136 Other intervertebral disc degeneration, lumbar region: Secondary | ICD-10-CM | POA: Diagnosis not present

## 2017-03-01 DIAGNOSIS — M9903 Segmental and somatic dysfunction of lumbar region: Secondary | ICD-10-CM | POA: Diagnosis not present

## 2017-03-09 DIAGNOSIS — M9903 Segmental and somatic dysfunction of lumbar region: Secondary | ICD-10-CM | POA: Diagnosis not present

## 2017-03-09 DIAGNOSIS — M5136 Other intervertebral disc degeneration, lumbar region: Secondary | ICD-10-CM | POA: Diagnosis not present

## 2017-03-15 DIAGNOSIS — M5136 Other intervertebral disc degeneration, lumbar region: Secondary | ICD-10-CM | POA: Diagnosis not present

## 2017-03-15 DIAGNOSIS — M9903 Segmental and somatic dysfunction of lumbar region: Secondary | ICD-10-CM | POA: Diagnosis not present

## 2017-03-18 DIAGNOSIS — M7522 Bicipital tendinitis, left shoulder: Secondary | ICD-10-CM | POA: Diagnosis not present

## 2017-03-18 DIAGNOSIS — M13849 Other specified arthritis, unspecified hand: Secondary | ICD-10-CM | POA: Diagnosis not present

## 2017-03-22 DIAGNOSIS — M9903 Segmental and somatic dysfunction of lumbar region: Secondary | ICD-10-CM | POA: Diagnosis not present

## 2017-03-22 DIAGNOSIS — M5136 Other intervertebral disc degeneration, lumbar region: Secondary | ICD-10-CM | POA: Diagnosis not present

## 2017-05-25 DIAGNOSIS — M5136 Other intervertebral disc degeneration, lumbar region: Secondary | ICD-10-CM | POA: Diagnosis not present

## 2017-05-25 DIAGNOSIS — M9903 Segmental and somatic dysfunction of lumbar region: Secondary | ICD-10-CM | POA: Diagnosis not present

## 2017-06-01 DIAGNOSIS — M5136 Other intervertebral disc degeneration, lumbar region: Secondary | ICD-10-CM | POA: Diagnosis not present

## 2017-06-01 DIAGNOSIS — M9903 Segmental and somatic dysfunction of lumbar region: Secondary | ICD-10-CM | POA: Diagnosis not present

## 2017-06-03 DIAGNOSIS — M5136 Other intervertebral disc degeneration, lumbar region: Secondary | ICD-10-CM | POA: Diagnosis not present

## 2017-06-03 DIAGNOSIS — M9903 Segmental and somatic dysfunction of lumbar region: Secondary | ICD-10-CM | POA: Diagnosis not present

## 2017-06-23 DIAGNOSIS — M9903 Segmental and somatic dysfunction of lumbar region: Secondary | ICD-10-CM | POA: Diagnosis not present

## 2017-06-23 DIAGNOSIS — M5136 Other intervertebral disc degeneration, lumbar region: Secondary | ICD-10-CM | POA: Diagnosis not present

## 2017-06-28 DIAGNOSIS — M5136 Other intervertebral disc degeneration, lumbar region: Secondary | ICD-10-CM | POA: Diagnosis not present

## 2017-06-28 DIAGNOSIS — M9903 Segmental and somatic dysfunction of lumbar region: Secondary | ICD-10-CM | POA: Diagnosis not present

## 2017-07-05 DIAGNOSIS — M5136 Other intervertebral disc degeneration, lumbar region: Secondary | ICD-10-CM | POA: Diagnosis not present

## 2017-07-05 DIAGNOSIS — M9903 Segmental and somatic dysfunction of lumbar region: Secondary | ICD-10-CM | POA: Diagnosis not present

## 2017-07-08 DIAGNOSIS — M9903 Segmental and somatic dysfunction of lumbar region: Secondary | ICD-10-CM | POA: Diagnosis not present

## 2017-07-08 DIAGNOSIS — M5136 Other intervertebral disc degeneration, lumbar region: Secondary | ICD-10-CM | POA: Diagnosis not present

## 2017-07-12 DIAGNOSIS — M9903 Segmental and somatic dysfunction of lumbar region: Secondary | ICD-10-CM | POA: Diagnosis not present

## 2017-07-12 DIAGNOSIS — M5136 Other intervertebral disc degeneration, lumbar region: Secondary | ICD-10-CM | POA: Diagnosis not present

## 2017-07-27 DIAGNOSIS — M9903 Segmental and somatic dysfunction of lumbar region: Secondary | ICD-10-CM | POA: Diagnosis not present

## 2017-07-27 DIAGNOSIS — M5136 Other intervertebral disc degeneration, lumbar region: Secondary | ICD-10-CM | POA: Diagnosis not present

## 2017-08-02 DIAGNOSIS — M5136 Other intervertebral disc degeneration, lumbar region: Secondary | ICD-10-CM | POA: Diagnosis not present

## 2017-08-02 DIAGNOSIS — M9903 Segmental and somatic dysfunction of lumbar region: Secondary | ICD-10-CM | POA: Diagnosis not present

## 2017-08-05 DIAGNOSIS — M5136 Other intervertebral disc degeneration, lumbar region: Secondary | ICD-10-CM | POA: Diagnosis not present

## 2017-08-05 DIAGNOSIS — M9903 Segmental and somatic dysfunction of lumbar region: Secondary | ICD-10-CM | POA: Diagnosis not present

## 2017-08-10 DIAGNOSIS — M9903 Segmental and somatic dysfunction of lumbar region: Secondary | ICD-10-CM | POA: Diagnosis not present

## 2017-08-10 DIAGNOSIS — M5136 Other intervertebral disc degeneration, lumbar region: Secondary | ICD-10-CM | POA: Diagnosis not present

## 2017-08-16 DIAGNOSIS — M5136 Other intervertebral disc degeneration, lumbar region: Secondary | ICD-10-CM | POA: Diagnosis not present

## 2017-08-16 DIAGNOSIS — M9903 Segmental and somatic dysfunction of lumbar region: Secondary | ICD-10-CM | POA: Diagnosis not present

## 2017-08-19 DIAGNOSIS — M1811 Unilateral primary osteoarthritis of first carpometacarpal joint, right hand: Secondary | ICD-10-CM | POA: Diagnosis not present

## 2017-08-19 DIAGNOSIS — M79644 Pain in right finger(s): Secondary | ICD-10-CM | POA: Diagnosis not present

## 2017-08-30 DIAGNOSIS — M5136 Other intervertebral disc degeneration, lumbar region: Secondary | ICD-10-CM | POA: Diagnosis not present

## 2017-08-30 DIAGNOSIS — M9903 Segmental and somatic dysfunction of lumbar region: Secondary | ICD-10-CM | POA: Diagnosis not present

## 2017-09-06 DIAGNOSIS — M5136 Other intervertebral disc degeneration, lumbar region: Secondary | ICD-10-CM | POA: Diagnosis not present

## 2017-09-06 DIAGNOSIS — M9903 Segmental and somatic dysfunction of lumbar region: Secondary | ICD-10-CM | POA: Diagnosis not present

## 2017-09-13 DIAGNOSIS — M9903 Segmental and somatic dysfunction of lumbar region: Secondary | ICD-10-CM | POA: Diagnosis not present

## 2017-09-13 DIAGNOSIS — M5136 Other intervertebral disc degeneration, lumbar region: Secondary | ICD-10-CM | POA: Diagnosis not present

## 2017-09-21 DIAGNOSIS — M5136 Other intervertebral disc degeneration, lumbar region: Secondary | ICD-10-CM | POA: Diagnosis not present

## 2017-09-21 DIAGNOSIS — M9903 Segmental and somatic dysfunction of lumbar region: Secondary | ICD-10-CM | POA: Diagnosis not present

## 2017-09-27 DIAGNOSIS — M9903 Segmental and somatic dysfunction of lumbar region: Secondary | ICD-10-CM | POA: Diagnosis not present

## 2017-09-27 DIAGNOSIS — M5136 Other intervertebral disc degeneration, lumbar region: Secondary | ICD-10-CM | POA: Diagnosis not present

## 2017-10-01 DIAGNOSIS — M13841 Other specified arthritis, right hand: Secondary | ICD-10-CM | POA: Diagnosis not present

## 2017-10-01 DIAGNOSIS — G8918 Other acute postprocedural pain: Secondary | ICD-10-CM | POA: Diagnosis not present

## 2017-10-01 DIAGNOSIS — M1811 Unilateral primary osteoarthritis of first carpometacarpal joint, right hand: Secondary | ICD-10-CM | POA: Diagnosis not present

## 2017-10-05 DIAGNOSIS — M5136 Other intervertebral disc degeneration, lumbar region: Secondary | ICD-10-CM | POA: Diagnosis not present

## 2017-10-05 DIAGNOSIS — M9903 Segmental and somatic dysfunction of lumbar region: Secondary | ICD-10-CM | POA: Diagnosis not present

## 2017-10-11 DIAGNOSIS — M5136 Other intervertebral disc degeneration, lumbar region: Secondary | ICD-10-CM | POA: Diagnosis not present

## 2017-10-11 DIAGNOSIS — M9903 Segmental and somatic dysfunction of lumbar region: Secondary | ICD-10-CM | POA: Diagnosis not present

## 2017-10-14 DIAGNOSIS — M25641 Stiffness of right hand, not elsewhere classified: Secondary | ICD-10-CM | POA: Diagnosis not present

## 2017-10-14 DIAGNOSIS — M9903 Segmental and somatic dysfunction of lumbar region: Secondary | ICD-10-CM | POA: Diagnosis not present

## 2017-10-14 DIAGNOSIS — M13841 Other specified arthritis, right hand: Secondary | ICD-10-CM | POA: Diagnosis not present

## 2017-10-14 DIAGNOSIS — M5136 Other intervertebral disc degeneration, lumbar region: Secondary | ICD-10-CM | POA: Diagnosis not present

## 2017-10-18 DIAGNOSIS — M5136 Other intervertebral disc degeneration, lumbar region: Secondary | ICD-10-CM | POA: Diagnosis not present

## 2017-10-18 DIAGNOSIS — M9903 Segmental and somatic dysfunction of lumbar region: Secondary | ICD-10-CM | POA: Diagnosis not present

## 2017-10-21 DIAGNOSIS — H5203 Hypermetropia, bilateral: Secondary | ICD-10-CM | POA: Diagnosis not present

## 2017-10-21 DIAGNOSIS — H52223 Regular astigmatism, bilateral: Secondary | ICD-10-CM | POA: Diagnosis not present

## 2017-10-21 DIAGNOSIS — H524 Presbyopia: Secondary | ICD-10-CM | POA: Diagnosis not present

## 2017-10-25 ENCOUNTER — Other Ambulatory Visit: Payer: Self-pay | Admitting: Obstetrics and Gynecology

## 2017-10-25 DIAGNOSIS — Z1231 Encounter for screening mammogram for malignant neoplasm of breast: Secondary | ICD-10-CM

## 2017-10-26 DIAGNOSIS — M9903 Segmental and somatic dysfunction of lumbar region: Secondary | ICD-10-CM | POA: Diagnosis not present

## 2017-10-26 DIAGNOSIS — M5136 Other intervertebral disc degeneration, lumbar region: Secondary | ICD-10-CM | POA: Diagnosis not present

## 2017-10-27 ENCOUNTER — Ambulatory Visit (HOSPITAL_COMMUNITY)
Admission: RE | Admit: 2017-10-27 | Discharge: 2017-10-27 | Disposition: A | Discharge status: Home / Self Care | Payer: BLUE CROSS/BLUE SHIELD | Source: Ambulatory Visit | Attending: Obstetrics and Gynecology | Admitting: Obstetrics and Gynecology

## 2017-10-27 DIAGNOSIS — Z1231 Encounter for screening mammogram for malignant neoplasm of breast: Secondary | ICD-10-CM

## 2017-10-28 ENCOUNTER — Other Ambulatory Visit: Payer: BLUE CROSS/BLUE SHIELD | Admitting: Obstetrics and Gynecology

## 2017-10-28 DIAGNOSIS — M1811 Unilateral primary osteoarthritis of first carpometacarpal joint, right hand: Secondary | ICD-10-CM | POA: Diagnosis not present

## 2017-10-28 DIAGNOSIS — G5621 Lesion of ulnar nerve, right upper limb: Secondary | ICD-10-CM | POA: Diagnosis not present

## 2017-10-28 DIAGNOSIS — Z4789 Encounter for other orthopedic aftercare: Secondary | ICD-10-CM | POA: Diagnosis not present

## 2017-10-28 DIAGNOSIS — M25641 Stiffness of right hand, not elsewhere classified: Secondary | ICD-10-CM | POA: Diagnosis not present

## 2017-10-28 DIAGNOSIS — M79644 Pain in right finger(s): Secondary | ICD-10-CM | POA: Diagnosis not present

## 2017-11-01 ENCOUNTER — Encounter: Payer: Self-pay | Admitting: Obstetrics and Gynecology

## 2017-11-01 ENCOUNTER — Ambulatory Visit (INDEPENDENT_AMBULATORY_CARE_PROVIDER_SITE_OTHER): Payer: BLUE CROSS/BLUE SHIELD | Admitting: Obstetrics and Gynecology

## 2017-11-01 ENCOUNTER — Other Ambulatory Visit (HOSPITAL_COMMUNITY)
Admission: RE | Admit: 2017-11-01 | Discharge: 2017-11-01 | Disposition: A | Discharge status: Home / Self Care | Payer: BLUE CROSS/BLUE SHIELD | Source: Ambulatory Visit | Attending: Obstetrics and Gynecology | Admitting: Obstetrics and Gynecology

## 2017-11-01 VITALS — BP 129/80 | HR 68 | Ht 65.0 in | Wt 124.4 lb

## 2017-11-01 DIAGNOSIS — Z01419 Encounter for gynecological examination (general) (routine) without abnormal findings: Secondary | ICD-10-CM | POA: Insufficient documentation

## 2017-11-01 DIAGNOSIS — M9903 Segmental and somatic dysfunction of lumbar region: Secondary | ICD-10-CM | POA: Diagnosis not present

## 2017-11-01 DIAGNOSIS — M5136 Other intervertebral disc degeneration, lumbar region: Secondary | ICD-10-CM | POA: Diagnosis not present

## 2017-11-01 NOTE — Patient Instructions (Signed)
Neuropathic pain.  Gabepentin vs Lyrica

## 2017-11-01 NOTE — Progress Notes (Signed)
Patient ID: Kathleen Nelson, female   DOB: Sep 26, 1955, 62 y.o.   MRN: [id number]   Assessment:  Annual Gyn Exam  Plan:  1. pap smear done, next pap due 3 years 2. return annually or prn 3    Annual mammogram advised 3-D preferred 4    CBC,CMET, lipid, tsh A1C Subjective:  Kathleen Nelson is a 62 y.o. female G2P0 who presents for annual exam. No LMP recorded. Patient is postmenopausal. The patient has complaints today of none. Recently had MCP joint fused in her right thumb.  The following portions of the patient's history were reviewed and updated as appropriate: allergies, current medications, past family history, past medical history, past social history, past surgical history and problem list. History reviewed. No pertinent past medical history.  Past Surgical History:  Procedure Laterality Date  . CARPAL TUNNEL RELEASE Left   . TUBAL LIGATION  1990     Current Outpatient Medications:  .  Cholecalciferol (VITAMIN D PO), Take by mouth., Disp: , Rfl:  .  Nutritional Supplements (PYCNOGENOL) 30 MG CAPS, Take 300 mg by mouth daily., Disp: , Rfl:   Review of Systems Constitutional: negative Gastrointestinal: negative Genitourinary: normal  Objective:  BP 129/80 (BP Location: Right Arm, Patient Position: Sitting, Cuff Size: Normal)   Pulse 68   Ht 5\' 5"  (1.651 m)   Wt 124 lb 6.4 oz (56.4 kg)   BMI 20.70 kg/m    BMI: Body mass index is 20.7 kg/m.  General Appearance: Alert, appropriate appearance for age. No acute distress HEENT: Grossly normal Neck / Thyroid: normal Cardiovascular: RRR; normal S1, S2, no murmur Lungs: CTA bilaterally Back: No CVAT Breast Exam: No dimpling, nipple retraction or discharge. No masses or nodes., Normal to inspection and Normal breast tissue bilaterally recently had Mammogram, per Dr. Erma Pinto review, results were normal Gastrointestinal: Soft, non-tender, no masses or organomegaly Pelvic Exam:  VAGINA: minimal vaginal secretion  CERVIX:  normal,  UTERUS: tiny, anterior, 1st degree uterine desensus Rectal exam: stool guaiac negative. PAP: Pap smear done today. Lymphatic Exam: Non-palpable nodes in neck, clavicular, axillary, or inguinal regions  Skin: no rash or abnormalities Neurologic: Normal gait and speech, no tremor  Psychiatric: Alert and oriented, appropriate affect.  Urinalysis:Not done   By signing my name below, I, Arnette Norris, attest that this documentation has been prepared under the direction and in the presence of Tilda Burrow, MD. Electronically Signed: Arnette Norris Medical Scribe. 11/01/17. 8:47 AM.  I personally performed the services described in this documentation, which was SCRIBED in my presence. The recorded information has been reviewed and considered accurate. It has been edited as necessary during review. Tilda Burrow, MD

## 2017-11-01 NOTE — Addendum Note (Signed)
Addended by: Federico Flake A on: 11/01/2017 09:25 AM   Modules accepted: Orders

## 2017-11-02 LAB — COMPREHENSIVE METABOLIC PANEL
ALBUMIN: 4.5 g/dL (ref 3.6–4.8)
ALK PHOS: 68 IU/L (ref 39–117)
ALT: 13 IU/L (ref 0–32)
AST: 15 IU/L (ref 0–40)
Albumin/Globulin Ratio: 2.3 — ABNORMAL HIGH (ref 1.2–2.2)
BUN/Creatinine Ratio: 22 (ref 12–28)
BUN: 20 mg/dL (ref 8–27)
Bilirubin Total: 0.6 mg/dL (ref 0.0–1.2)
CHLORIDE: 102 mmol/L (ref 96–106)
CO2: 24 mmol/L (ref 20–29)
Calcium: 9.5 mg/dL (ref 8.7–10.3)
Creatinine, Ser: 0.9 mg/dL (ref 0.57–1.00)
GFR calc Af Amer: 80 mL/min/{1.73_m2} (ref >59)
GFR calc non Af Amer: 69 mL/min/{1.73_m2} (ref >59)
GLUCOSE: 86 mg/dL (ref 65–99)
Globulin, Total: 2 g/dL (ref 1.5–4.5)
POTASSIUM: 4.1 mmol/L (ref 3.5–5.2)
SODIUM: 142 mmol/L (ref 134–144)
TOTAL PROTEIN: 6.5 g/dL (ref 6.0–8.5)

## 2017-11-02 LAB — LIPID PANEL
Chol/HDL Ratio: 2.9 ratio (ref 0.0–4.4)
Cholesterol, Total: 168 mg/dL (ref 100–199)
HDL: 58 mg/dL (ref >39)
LDL Calculated: 91 mg/dL (ref 0–99)
Triglycerides: 95 mg/dL (ref 0–149)
VLDL Cholesterol Cal: 19 mg/dL (ref 5–40)

## 2017-11-02 LAB — CBC
Hematocrit: 44.9 % (ref 34.0–46.6)
Hemoglobin: 15.1 g/dL (ref 11.1–15.9)
MCH: 30.6 pg (ref 26.6–33.0)
MCHC: 33.6 g/dL (ref 31.5–35.7)
MCV: 91 fL (ref 79–97)
Platelets: 210 10*3/uL (ref 150–450)
RBC: 4.93 x10E6/uL (ref 3.77–5.28)
RDW: 12.8 % (ref 12.3–15.4)
WBC: 5.1 10*3/uL (ref 3.4–10.8)

## 2017-11-02 LAB — CYTOLOGY - PAP
Diagnosis: NEGATIVE
HPV: NOT DETECTED

## 2017-11-02 LAB — HEMOGLOBIN A1C
ESTIMATED AVERAGE GLUCOSE: 111 mg/dL
Hgb A1c MFr Bld: 5.5 % (ref 4.8–5.6)

## 2017-11-02 LAB — TSH: TSH: 1.66 u[IU]/mL (ref 0.450–4.500)

## 2017-11-08 DIAGNOSIS — M5136 Other intervertebral disc degeneration, lumbar region: Secondary | ICD-10-CM | POA: Diagnosis not present

## 2017-11-08 DIAGNOSIS — M9903 Segmental and somatic dysfunction of lumbar region: Secondary | ICD-10-CM | POA: Diagnosis not present

## 2017-11-17 DIAGNOSIS — M5136 Other intervertebral disc degeneration, lumbar region: Secondary | ICD-10-CM | POA: Diagnosis not present

## 2017-11-17 DIAGNOSIS — M9903 Segmental and somatic dysfunction of lumbar region: Secondary | ICD-10-CM | POA: Diagnosis not present

## 2017-11-24 DIAGNOSIS — M9903 Segmental and somatic dysfunction of lumbar region: Secondary | ICD-10-CM | POA: Diagnosis not present

## 2017-11-24 DIAGNOSIS — M5136 Other intervertebral disc degeneration, lumbar region: Secondary | ICD-10-CM | POA: Diagnosis not present

## 2017-11-25 DIAGNOSIS — G5621 Lesion of ulnar nerve, right upper limb: Secondary | ICD-10-CM | POA: Diagnosis not present

## 2017-12-01 DIAGNOSIS — M9903 Segmental and somatic dysfunction of lumbar region: Secondary | ICD-10-CM | POA: Diagnosis not present

## 2017-12-01 DIAGNOSIS — M5136 Other intervertebral disc degeneration, lumbar region: Secondary | ICD-10-CM | POA: Diagnosis not present

## 2017-12-08 DIAGNOSIS — M5136 Other intervertebral disc degeneration, lumbar region: Secondary | ICD-10-CM | POA: Diagnosis not present

## 2017-12-08 DIAGNOSIS — M9903 Segmental and somatic dysfunction of lumbar region: Secondary | ICD-10-CM | POA: Diagnosis not present

## 2017-12-13 DIAGNOSIS — M5136 Other intervertebral disc degeneration, lumbar region: Secondary | ICD-10-CM | POA: Diagnosis not present

## 2017-12-13 DIAGNOSIS — M9903 Segmental and somatic dysfunction of lumbar region: Secondary | ICD-10-CM | POA: Diagnosis not present

## 2017-12-14 DIAGNOSIS — M5136 Other intervertebral disc degeneration, lumbar region: Secondary | ICD-10-CM | POA: Diagnosis not present

## 2017-12-14 DIAGNOSIS — M9903 Segmental and somatic dysfunction of lumbar region: Secondary | ICD-10-CM | POA: Diagnosis not present

## 2017-12-20 DIAGNOSIS — M9903 Segmental and somatic dysfunction of lumbar region: Secondary | ICD-10-CM | POA: Diagnosis not present

## 2017-12-20 DIAGNOSIS — M5136 Other intervertebral disc degeneration, lumbar region: Secondary | ICD-10-CM | POA: Diagnosis not present

## 2017-12-27 DIAGNOSIS — M9903 Segmental and somatic dysfunction of lumbar region: Secondary | ICD-10-CM | POA: Diagnosis not present

## 2017-12-27 DIAGNOSIS — M5136 Other intervertebral disc degeneration, lumbar region: Secondary | ICD-10-CM | POA: Diagnosis not present

## 2017-12-29 DIAGNOSIS — G5621 Lesion of ulnar nerve, right upper limb: Secondary | ICD-10-CM | POA: Diagnosis not present

## 2018-01-03 DIAGNOSIS — M5136 Other intervertebral disc degeneration, lumbar region: Secondary | ICD-10-CM | POA: Diagnosis not present

## 2018-01-03 DIAGNOSIS — M9903 Segmental and somatic dysfunction of lumbar region: Secondary | ICD-10-CM | POA: Diagnosis not present

## 2018-01-17 DIAGNOSIS — M5136 Other intervertebral disc degeneration, lumbar region: Secondary | ICD-10-CM | POA: Diagnosis not present

## 2018-01-17 DIAGNOSIS — M9903 Segmental and somatic dysfunction of lumbar region: Secondary | ICD-10-CM | POA: Diagnosis not present

## 2018-01-24 DIAGNOSIS — M9903 Segmental and somatic dysfunction of lumbar region: Secondary | ICD-10-CM | POA: Diagnosis not present

## 2018-01-24 DIAGNOSIS — M5136 Other intervertebral disc degeneration, lumbar region: Secondary | ICD-10-CM | POA: Diagnosis not present

## 2018-01-31 DIAGNOSIS — M9903 Segmental and somatic dysfunction of lumbar region: Secondary | ICD-10-CM | POA: Diagnosis not present

## 2018-01-31 DIAGNOSIS — M5136 Other intervertebral disc degeneration, lumbar region: Secondary | ICD-10-CM | POA: Diagnosis not present

## 2018-02-02 DIAGNOSIS — M9903 Segmental and somatic dysfunction of lumbar region: Secondary | ICD-10-CM | POA: Diagnosis not present

## 2018-02-02 DIAGNOSIS — M5136 Other intervertebral disc degeneration, lumbar region: Secondary | ICD-10-CM | POA: Diagnosis not present

## 2018-02-15 DIAGNOSIS — M9903 Segmental and somatic dysfunction of lumbar region: Secondary | ICD-10-CM | POA: Diagnosis not present

## 2018-02-15 DIAGNOSIS — M5136 Other intervertebral disc degeneration, lumbar region: Secondary | ICD-10-CM | POA: Diagnosis not present

## 2018-02-17 DIAGNOSIS — D1801 Hemangioma of skin and subcutaneous tissue: Secondary | ICD-10-CM | POA: Diagnosis not present

## 2018-02-17 DIAGNOSIS — L819 Disorder of pigmentation, unspecified: Secondary | ICD-10-CM | POA: Diagnosis not present

## 2018-02-17 DIAGNOSIS — L570 Actinic keratosis: Secondary | ICD-10-CM | POA: Diagnosis not present

## 2018-02-17 DIAGNOSIS — M5136 Other intervertebral disc degeneration, lumbar region: Secondary | ICD-10-CM | POA: Diagnosis not present

## 2018-02-17 DIAGNOSIS — M9903 Segmental and somatic dysfunction of lumbar region: Secondary | ICD-10-CM | POA: Diagnosis not present

## 2018-02-21 DIAGNOSIS — M5136 Other intervertebral disc degeneration, lumbar region: Secondary | ICD-10-CM | POA: Diagnosis not present

## 2018-02-21 DIAGNOSIS — M9903 Segmental and somatic dysfunction of lumbar region: Secondary | ICD-10-CM | POA: Diagnosis not present

## 2018-02-28 DIAGNOSIS — M5136 Other intervertebral disc degeneration, lumbar region: Secondary | ICD-10-CM | POA: Diagnosis not present

## 2018-02-28 DIAGNOSIS — M9903 Segmental and somatic dysfunction of lumbar region: Secondary | ICD-10-CM | POA: Diagnosis not present

## 2018-03-09 DIAGNOSIS — M9903 Segmental and somatic dysfunction of lumbar region: Secondary | ICD-10-CM | POA: Diagnosis not present

## 2018-03-09 DIAGNOSIS — M5136 Other intervertebral disc degeneration, lumbar region: Secondary | ICD-10-CM | POA: Diagnosis not present

## 2018-03-14 DIAGNOSIS — M5136 Other intervertebral disc degeneration, lumbar region: Secondary | ICD-10-CM | POA: Diagnosis not present

## 2018-03-14 DIAGNOSIS — M9903 Segmental and somatic dysfunction of lumbar region: Secondary | ICD-10-CM | POA: Diagnosis not present

## 2018-03-21 DIAGNOSIS — M9903 Segmental and somatic dysfunction of lumbar region: Secondary | ICD-10-CM | POA: Diagnosis not present

## 2018-03-21 DIAGNOSIS — M5136 Other intervertebral disc degeneration, lumbar region: Secondary | ICD-10-CM | POA: Diagnosis not present

## 2018-03-31 DIAGNOSIS — M5136 Other intervertebral disc degeneration, lumbar region: Secondary | ICD-10-CM | POA: Diagnosis not present

## 2018-03-31 DIAGNOSIS — M9903 Segmental and somatic dysfunction of lumbar region: Secondary | ICD-10-CM | POA: Diagnosis not present

## 2018-06-20 DIAGNOSIS — E559 Vitamin D deficiency, unspecified: Secondary | ICD-10-CM | POA: Diagnosis not present

## 2018-06-20 DIAGNOSIS — N951 Menopausal and female climacteric states: Secondary | ICD-10-CM | POA: Diagnosis not present

## 2018-06-20 DIAGNOSIS — R5383 Other fatigue: Secondary | ICD-10-CM | POA: Diagnosis not present

## 2018-06-20 DIAGNOSIS — R6882 Decreased libido: Secondary | ICD-10-CM | POA: Diagnosis not present

## 2018-07-04 DIAGNOSIS — M9903 Segmental and somatic dysfunction of lumbar region: Secondary | ICD-10-CM | POA: Diagnosis not present

## 2018-07-04 DIAGNOSIS — M5136 Other intervertebral disc degeneration, lumbar region: Secondary | ICD-10-CM | POA: Diagnosis not present

## 2018-07-11 DIAGNOSIS — N951 Menopausal and female climacteric states: Secondary | ICD-10-CM | POA: Diagnosis not present

## 2018-07-11 DIAGNOSIS — R6882 Decreased libido: Secondary | ICD-10-CM | POA: Diagnosis not present

## 2018-07-11 DIAGNOSIS — M8580 Other specified disorders of bone density and structure, unspecified site: Secondary | ICD-10-CM | POA: Diagnosis not present

## 2018-07-25 DIAGNOSIS — M9903 Segmental and somatic dysfunction of lumbar region: Secondary | ICD-10-CM | POA: Diagnosis not present

## 2018-07-25 DIAGNOSIS — M5136 Other intervertebral disc degeneration, lumbar region: Secondary | ICD-10-CM | POA: Diagnosis not present

## 2018-08-04 DIAGNOSIS — M5136 Other intervertebral disc degeneration, lumbar region: Secondary | ICD-10-CM | POA: Diagnosis not present

## 2018-08-04 DIAGNOSIS — M9903 Segmental and somatic dysfunction of lumbar region: Secondary | ICD-10-CM | POA: Diagnosis not present

## 2018-11-08 ENCOUNTER — Other Ambulatory Visit: Payer: Self-pay

## 2018-11-08 DIAGNOSIS — Z20822 Contact with and (suspected) exposure to covid-19: Secondary | ICD-10-CM

## 2018-11-08 DIAGNOSIS — Z20828 Contact with and (suspected) exposure to other viral communicable diseases: Secondary | ICD-10-CM | POA: Diagnosis not present

## 2018-11-09 LAB — NOVEL CORONAVIRUS, NAA: SARS-CoV-2, NAA: NOT DETECTED

## 2018-12-02 ENCOUNTER — Other Ambulatory Visit: Payer: Self-pay

## 2018-12-02 ENCOUNTER — Other Ambulatory Visit (HOSPITAL_COMMUNITY): Payer: Self-pay | Admitting: Obstetrics and Gynecology

## 2018-12-02 DIAGNOSIS — Z20822 Contact with and (suspected) exposure to covid-19: Secondary | ICD-10-CM

## 2018-12-02 DIAGNOSIS — Z1231 Encounter for screening mammogram for malignant neoplasm of breast: Secondary | ICD-10-CM

## 2018-12-05 DIAGNOSIS — H5203 Hypermetropia, bilateral: Secondary | ICD-10-CM | POA: Diagnosis not present

## 2018-12-05 LAB — NOVEL CORONAVIRUS, NAA: SARS-CoV-2, NAA: NOT DETECTED

## 2018-12-09 ENCOUNTER — Other Ambulatory Visit: Payer: Self-pay

## 2018-12-09 DIAGNOSIS — Z20822 Contact with and (suspected) exposure to covid-19: Secondary | ICD-10-CM

## 2018-12-11 LAB — NOVEL CORONAVIRUS, NAA: SARS-CoV-2, NAA: NOT DETECTED

## 2018-12-20 ENCOUNTER — Other Ambulatory Visit: Payer: Self-pay

## 2018-12-20 DIAGNOSIS — Z20822 Contact with and (suspected) exposure to covid-19: Secondary | ICD-10-CM

## 2018-12-21 ENCOUNTER — Ambulatory Visit (HOSPITAL_COMMUNITY): Payer: BLUE CROSS/BLUE SHIELD

## 2018-12-22 ENCOUNTER — Other Ambulatory Visit: Payer: BLUE CROSS/BLUE SHIELD | Admitting: Obstetrics and Gynecology

## 2018-12-22 LAB — NOVEL CORONAVIRUS, NAA: SARS-CoV-2, NAA: NOT DETECTED

## 2018-12-26 ENCOUNTER — Ambulatory Visit (HOSPITAL_COMMUNITY)
Admission: RE | Admit: 2018-12-26 | Discharge: 2018-12-26 | Disposition: A | Discharge status: Home / Self Care | Payer: BC Managed Care – PPO | Source: Ambulatory Visit | Attending: Obstetrics and Gynecology | Admitting: Obstetrics and Gynecology

## 2018-12-26 ENCOUNTER — Other Ambulatory Visit: Payer: Self-pay

## 2018-12-26 ENCOUNTER — Ambulatory Visit (INDEPENDENT_AMBULATORY_CARE_PROVIDER_SITE_OTHER): Payer: BC Managed Care – PPO

## 2018-12-26 DIAGNOSIS — Z1231 Encounter for screening mammogram for malignant neoplasm of breast: Secondary | ICD-10-CM | POA: Diagnosis not present

## 2018-12-26 DIAGNOSIS — Z23 Encounter for immunization: Secondary | ICD-10-CM

## 2019-01-11 ENCOUNTER — Encounter: Payer: Self-pay | Admitting: Obstetrics and Gynecology

## 2019-01-11 ENCOUNTER — Other Ambulatory Visit: Payer: Self-pay | Admitting: Obstetrics and Gynecology

## 2019-01-11 ENCOUNTER — Ambulatory Visit (INDEPENDENT_AMBULATORY_CARE_PROVIDER_SITE_OTHER): Payer: BC Managed Care – PPO | Admitting: Obstetrics and Gynecology

## 2019-01-11 ENCOUNTER — Other Ambulatory Visit: Payer: Self-pay

## 2019-01-11 VITALS — BP 131/76 | HR 67 | Ht 65.0 in | Wt 127.6 lb

## 2019-01-11 DIAGNOSIS — N6310 Unspecified lump in the right breast, unspecified quadrant: Secondary | ICD-10-CM

## 2019-01-11 DIAGNOSIS — Z0000 Encounter for general adult medical examination without abnormal findings: Secondary | ICD-10-CM

## 2019-01-11 NOTE — Progress Notes (Addendum)
  Assessment:  Annual Gyn Physical Exam Vague tender mass in UOQ right breast, will obtain diagnostic mammogram, with ultrasound. Scheduled for Jan 12, 10 am.  Plan:  1. pap smear NOT done, next pap due 4 years 2. return annually or prn for breast check, hemoccult  3    Diagnostic mammogram ordered. Subjective:  Kathleen Nelson is a 63 y.o. female G2P0 who presents for annual exam. No LMP recorded. Patient is postmenopausal. The patient has complaints today of none. Would like to find new PCP, wasn't very interested with Dr. Anastasio Champion as PCP. Will use [facility]  The following portions of the patient's history were reviewed and updated as appropriate: allergies, current medications, past family history, past medical history, past social history, past surgical history and problem list. History reviewed. No pertinent past medical history.  Past Surgical History:  Procedure Laterality Date  . CARPAL TUNNEL RELEASE Left   . rt thumb surgery    . rt thump surgery    . TUBAL LIGATION  1990     Current Outpatient Medications:  .  Cholecalciferol (VITAMIN D PO), Take by mouth., Disp: , Rfl:  .  Nutritional Supplements (PYCNOGENOL) 30 MG CAPS, Take 300 mg by mouth daily., Disp: , Rfl:   Review of Systems Constitutional: negative Gastrointestinal: negative Genitourinary: normal  Objective:  BP 131/76 (BP Location: Right Arm, Patient Position: Sitting, Cuff Size: Normal)   Pulse 67   Ht 5\' 5"  (1.651 m)   Wt 127 lb 9.6 oz (57.9 kg)   BMI 21.23 kg/m    BMI: Body mass index is 21.23 kg/m.  General Appearance: Alert, appropriate appearance for age. No acute distress HEENT: Grossly normal Neck / Thyroid: normal Cardiovascular: RRR; normal S1, S2, no murmur Lungs: CTA bilaterally Back: No CVAT Breast Exam: No masses or nodes. No dimpling, nipple retraction or discharge, some tenderness in right breast, mobile increased firmness in upper outer quadrant of right breast. 3D Mammo on  12/26/2018 results were normal. Gastrointestinal: Soft, non-tender, no masses or organomegaly Pelvic Exam:  VAGINA:  Postmenopausal normal appearing vagina CERVIX: normal appearing cervix good support  UTERUS: uterus is normal, non-tender RECTAL: guaiac negative stool obtained Lymphatic Exam: Non-palpable nodes in neck, clavicular, axillary, or inguinal regions Skin: no rash or abnormalities Neurologic: Normal gait and speech, no tremor  Psychiatric: Alert and oriented, appropriate affect.  Urinalysis:Not done  By signing my name below, I, Samul Dada, attest that this documentation has been prepared under the direction and in the presence of Jonnie Kind, MD. Electronically Signed: [place]. 01/11/19. 9:14 AM.  I personally performed the services described in this documentation, which was SCRIBED in my presence. The recorded information has been reviewed and considered accurate. It has been edited as necessary during review. Jonnie Kind, MD

## 2019-01-24 ENCOUNTER — Ambulatory Visit (HOSPITAL_COMMUNITY)
Admission: RE | Admit: 2019-01-24 | Discharge: 2019-01-24 | Disposition: A | Discharge status: Home / Self Care | Payer: BC Managed Care – PPO | Source: Ambulatory Visit | Attending: Obstetrics and Gynecology | Admitting: Obstetrics and Gynecology

## 2019-01-24 ENCOUNTER — Other Ambulatory Visit: Payer: Self-pay

## 2019-01-24 DIAGNOSIS — N6489 Other specified disorders of breast: Secondary | ICD-10-CM | POA: Diagnosis not present

## 2019-01-24 DIAGNOSIS — N6310 Unspecified lump in the right breast, unspecified quadrant: Secondary | ICD-10-CM | POA: Insufficient documentation

## 2019-01-24 DIAGNOSIS — R922 Inconclusive mammogram: Secondary | ICD-10-CM | POA: Diagnosis not present

## 2019-01-24 NOTE — Progress Notes (Signed)
Negative mammogram, and ultrasound. Very reassuring.

## 2019-01-31 ENCOUNTER — Other Ambulatory Visit (HOSPITAL_COMMUNITY): Payer: BC Managed Care – PPO

## 2019-01-31 ENCOUNTER — Encounter (HOSPITAL_COMMUNITY): Payer: BC Managed Care – PPO

## 2019-03-08 DIAGNOSIS — L570 Actinic keratosis: Secondary | ICD-10-CM | POA: Diagnosis not present

## 2019-03-08 DIAGNOSIS — D239 Other benign neoplasm of skin, unspecified: Secondary | ICD-10-CM | POA: Diagnosis not present

## 2019-03-08 DIAGNOSIS — L738 Other specified follicular disorders: Secondary | ICD-10-CM | POA: Diagnosis not present

## 2019-03-08 DIAGNOSIS — L819 Disorder of pigmentation, unspecified: Secondary | ICD-10-CM | POA: Diagnosis not present

## 2019-08-22 DIAGNOSIS — H10411 Chronic giant papillary conjunctivitis, right eye: Secondary | ICD-10-CM | POA: Diagnosis not present

## 2019-10-12 DIAGNOSIS — M25532 Pain in left wrist: Secondary | ICD-10-CM | POA: Diagnosis not present

## 2019-10-12 DIAGNOSIS — M79672 Pain in left foot: Secondary | ICD-10-CM | POA: Diagnosis not present

## 2019-10-12 DIAGNOSIS — S52502A Unspecified fracture of the lower end of left radius, initial encounter for closed fracture: Secondary | ICD-10-CM | POA: Diagnosis not present

## 2019-10-12 DIAGNOSIS — S92325A Nondisplaced fracture of second metatarsal bone, left foot, initial encounter for closed fracture: Secondary | ICD-10-CM | POA: Diagnosis not present

## 2019-10-12 DIAGNOSIS — S92325D Nondisplaced fracture of second metatarsal bone, left foot, subsequent encounter for fracture with routine healing: Secondary | ICD-10-CM | POA: Diagnosis not present

## 2019-10-12 DIAGNOSIS — S300XXA Contusion of lower back and pelvis, initial encounter: Secondary | ICD-10-CM | POA: Diagnosis not present

## 2019-10-18 DIAGNOSIS — S52502A Unspecified fracture of the lower end of left radius, initial encounter for closed fracture: Secondary | ICD-10-CM | POA: Diagnosis not present

## 2019-10-18 DIAGNOSIS — S92302A Fracture of unspecified metatarsal bone(s), left foot, initial encounter for closed fracture: Secondary | ICD-10-CM | POA: Diagnosis not present

## 2019-10-18 DIAGNOSIS — R102 Pelvic and perineal pain: Secondary | ICD-10-CM | POA: Diagnosis not present

## 2019-10-25 DIAGNOSIS — S92302A Fracture of unspecified metatarsal bone(s), left foot, initial encounter for closed fracture: Secondary | ICD-10-CM | POA: Diagnosis not present

## 2019-10-25 DIAGNOSIS — S52502A Unspecified fracture of the lower end of left radius, initial encounter for closed fracture: Secondary | ICD-10-CM | POA: Diagnosis not present

## 2019-11-01 DIAGNOSIS — S92302A Fracture of unspecified metatarsal bone(s), left foot, initial encounter for closed fracture: Secondary | ICD-10-CM | POA: Diagnosis not present

## 2019-11-01 DIAGNOSIS — S52502D Unspecified fracture of the lower end of left radius, subsequent encounter for closed fracture with routine healing: Secondary | ICD-10-CM | POA: Diagnosis not present

## 2019-11-15 DIAGNOSIS — S52502D Unspecified fracture of the lower end of left radius, subsequent encounter for closed fracture with routine healing: Secondary | ICD-10-CM | POA: Diagnosis not present

## 2019-11-15 DIAGNOSIS — S92302A Fracture of unspecified metatarsal bone(s), left foot, initial encounter for closed fracture: Secondary | ICD-10-CM | POA: Diagnosis not present

## 2019-11-23 ENCOUNTER — Ambulatory Visit: Payer: BC Managed Care – PPO | Attending: Internal Medicine

## 2019-11-23 DIAGNOSIS — H5203 Hypermetropia, bilateral: Secondary | ICD-10-CM | POA: Diagnosis not present

## 2019-11-23 DIAGNOSIS — Z23 Encounter for immunization: Secondary | ICD-10-CM

## 2019-11-23 NOTE — Progress Notes (Signed)
   Covid-19 Vaccination Clinic  Name:  Kathleen Nelson    MRN: [id number] DOB: [birth year]  11/23/2019  [name] was observed post Covid-19 immunization for 15 minutes without incident. She was provided with Vaccine Information Sheet and instruction to access the V-Safe system.   [name] was instructed to call 911 with any severe reactions post vaccine: Marland Kitchen Difficulty breathing  . Swelling of face and throat  . A fast heartbeat  . A bad rash all over body  . Dizziness and weakness

## 2019-11-27 ENCOUNTER — Other Ambulatory Visit (HOSPITAL_COMMUNITY): Payer: Self-pay | Admitting: Adult Health

## 2019-11-27 DIAGNOSIS — Z1231 Encounter for screening mammogram for malignant neoplasm of breast: Secondary | ICD-10-CM

## 2019-12-07 DIAGNOSIS — M19079 Primary osteoarthritis, unspecified ankle and foot: Secondary | ICD-10-CM | POA: Diagnosis not present

## 2019-12-07 DIAGNOSIS — M25532 Pain in left wrist: Secondary | ICD-10-CM | POA: Diagnosis not present

## 2019-12-07 DIAGNOSIS — S92335D Nondisplaced fracture of third metatarsal bone, left foot, subsequent encounter for fracture with routine healing: Secondary | ICD-10-CM | POA: Diagnosis not present

## 2019-12-07 DIAGNOSIS — S52502D Unspecified fracture of the lower end of left radius, subsequent encounter for closed fracture with routine healing: Secondary | ICD-10-CM | POA: Diagnosis not present

## 2019-12-07 DIAGNOSIS — S92325D Nondisplaced fracture of second metatarsal bone, left foot, subsequent encounter for fracture with routine healing: Secondary | ICD-10-CM | POA: Diagnosis not present

## 2019-12-29 ENCOUNTER — Encounter: Payer: Self-pay | Admitting: Adult Health

## 2020-01-17 ENCOUNTER — Other Ambulatory Visit: Payer: Self-pay | Admitting: *Deleted

## 2020-01-17 DIAGNOSIS — Z780 Asymptomatic menopausal state: Secondary | ICD-10-CM

## 2020-01-25 ENCOUNTER — Other Ambulatory Visit (HOSPITAL_COMMUNITY): Payer: BC Managed Care – PPO

## 2020-01-25 ENCOUNTER — Ambulatory Visit (HOSPITAL_COMMUNITY): Payer: BC Managed Care – PPO

## 2020-01-30 ENCOUNTER — Other Ambulatory Visit: Payer: BC Managed Care – PPO | Admitting: Adult Health

## 2020-02-08 ENCOUNTER — Other Ambulatory Visit: Payer: BC Managed Care – PPO | Admitting: Adult Health

## 2020-02-14 ENCOUNTER — Other Ambulatory Visit: Payer: Self-pay

## 2020-02-14 ENCOUNTER — Ambulatory Visit (HOSPITAL_COMMUNITY)
Admission: RE | Admit: 2020-02-14 | Discharge: 2020-02-14 | Disposition: A | Discharge status: Home / Self Care | Payer: BC Managed Care – PPO | Source: Ambulatory Visit | Attending: Adult Health | Admitting: Adult Health

## 2020-02-14 ENCOUNTER — Other Ambulatory Visit (HOSPITAL_COMMUNITY): Payer: BC Managed Care – PPO

## 2020-02-14 ENCOUNTER — Ambulatory Visit (HOSPITAL_COMMUNITY): Payer: BC Managed Care – PPO

## 2020-02-14 DIAGNOSIS — Z1231 Encounter for screening mammogram for malignant neoplasm of breast: Secondary | ICD-10-CM | POA: Diagnosis not present

## 2020-02-14 DIAGNOSIS — Z780 Asymptomatic menopausal state: Secondary | ICD-10-CM

## 2020-02-14 DIAGNOSIS — Z13820 Encounter for screening for osteoporosis: Secondary | ICD-10-CM | POA: Diagnosis not present

## 2020-02-14 DIAGNOSIS — Z8739 Personal history of other diseases of the musculoskeletal system and connective tissue: Secondary | ICD-10-CM | POA: Diagnosis not present

## 2020-02-16 ENCOUNTER — Encounter: Payer: Self-pay | Admitting: Adult Health

## 2020-02-16 DIAGNOSIS — M810 Age-related osteoporosis without current pathological fracture: Secondary | ICD-10-CM

## 2020-02-16 HISTORY — DX: Age-related osteoporosis without current pathological fracture: M81.0

## 2020-02-22 ENCOUNTER — Other Ambulatory Visit: Payer: Self-pay | Admitting: Adult Health

## 2020-02-22 DIAGNOSIS — Z1321 Encounter for screening for nutritional disorder: Secondary | ICD-10-CM

## 2020-02-22 DIAGNOSIS — Z01419 Encounter for gynecological examination (general) (routine) without abnormal findings: Secondary | ICD-10-CM

## 2020-02-22 DIAGNOSIS — Z131 Encounter for screening for diabetes mellitus: Secondary | ICD-10-CM

## 2020-02-22 NOTE — Progress Notes (Signed)
Ck labs before appt  A1c ,vitamin D, CBC,CMP,TSH and lipids,orders in

## 2020-02-26 DIAGNOSIS — Z131 Encounter for screening for diabetes mellitus: Secondary | ICD-10-CM | POA: Diagnosis not present

## 2020-02-26 DIAGNOSIS — Z01419 Encounter for gynecological examination (general) (routine) without abnormal findings: Secondary | ICD-10-CM | POA: Diagnosis not present

## 2020-02-26 DIAGNOSIS — Z1321 Encounter for screening for nutritional disorder: Secondary | ICD-10-CM | POA: Diagnosis not present

## 2020-02-27 LAB — CBC
Hematocrit: 42.9 % (ref 34.0–46.6)
Hemoglobin: 14.3 g/dL (ref 11.1–15.9)
MCH: 30.7 pg (ref 26.6–33.0)
MCHC: 33.3 g/dL (ref 31.5–35.7)
MCV: 92 fL (ref 79–97)
Platelets: 228 10*3/uL (ref 150–450)
RBC: 4.66 x10E6/uL (ref 3.77–5.28)
RDW: 12.1 % (ref 11.7–15.4)
WBC: 4.9 10*3/uL (ref 3.4–10.8)

## 2020-02-27 LAB — COMPREHENSIVE METABOLIC PANEL
ALT: 22 IU/L (ref 0–32)
AST: 25 IU/L (ref 0–40)
Albumin/Globulin Ratio: 2.2 (ref 1.2–2.2)
Albumin: 4.6 g/dL (ref 3.8–4.8)
Alkaline Phosphatase: 76 IU/L (ref 44–121)
BUN/Creatinine Ratio: 26 (ref 12–28)
BUN: 23 mg/dL (ref 8–27)
Bilirubin Total: 0.6 mg/dL (ref 0.0–1.2)
CO2: 24 mmol/L (ref 20–29)
Calcium: 10.3 mg/dL (ref 8.7–10.3)
Chloride: 102 mmol/L (ref 96–106)
Creatinine, Ser: 0.9 mg/dL (ref 0.57–1.00)
GFR calc Af Amer: 79 mL/min/{1.73_m2} (ref >59)
GFR calc non Af Amer: 68 mL/min/{1.73_m2} (ref >59)
Globulin, Total: 2.1 g/dL (ref 1.5–4.5)
Glucose: 86 mg/dL (ref 65–99)
Potassium: 4.1 mmol/L (ref 3.5–5.2)
Sodium: 141 mmol/L (ref 134–144)
Total Protein: 6.7 g/dL (ref 6.0–8.5)

## 2020-02-27 LAB — LIPID PANEL
Chol/HDL Ratio: 3.2 ratio (ref 0.0–4.4)
Cholesterol, Total: 216 mg/dL — ABNORMAL HIGH (ref 100–199)
HDL: 68 mg/dL (ref >39)
LDL Chol Calc (NIH): 137 mg/dL — ABNORMAL HIGH (ref 0–99)
Triglycerides: 63 mg/dL (ref 0–149)
VLDL Cholesterol Cal: 11 mg/dL (ref 5–40)

## 2020-02-27 LAB — HEMOGLOBIN A1C
Est. average glucose Bld gHb Est-mCnc: 114 mg/dL
Hgb A1c MFr Bld: 5.6 % (ref 4.8–5.6)

## 2020-02-27 LAB — VITAMIN D 25 HYDROXY (VIT D DEFICIENCY, FRACTURES): Vit D, 25-Hydroxy: 47.2 ng/mL (ref 30.0–100.0)

## 2020-02-27 LAB — TSH: TSH: 2 u[IU]/mL (ref 0.450–4.500)

## 2020-02-28 ENCOUNTER — Ambulatory Visit (INDEPENDENT_AMBULATORY_CARE_PROVIDER_SITE_OTHER): Payer: BC Managed Care – PPO | Admitting: Adult Health

## 2020-02-28 ENCOUNTER — Other Ambulatory Visit: Payer: Self-pay

## 2020-02-28 ENCOUNTER — Encounter: Payer: Self-pay | Admitting: Adult Health

## 2020-02-28 ENCOUNTER — Other Ambulatory Visit (HOSPITAL_COMMUNITY)
Admission: RE | Admit: 2020-02-28 | Discharge: 2020-02-28 | Disposition: A | Discharge status: Home / Self Care | Payer: BC Managed Care – PPO | Source: Ambulatory Visit | Attending: Adult Health | Admitting: Adult Health

## 2020-02-28 VITALS — BP 115/76 | HR 82 | Ht 64.0 in | Wt 131.0 lb

## 2020-02-28 DIAGNOSIS — Z1211 Encounter for screening for malignant neoplasm of colon: Secondary | ICD-10-CM | POA: Diagnosis not present

## 2020-02-28 DIAGNOSIS — Z780 Asymptomatic menopausal state: Secondary | ICD-10-CM | POA: Diagnosis not present

## 2020-02-28 DIAGNOSIS — Z01419 Encounter for gynecological examination (general) (routine) without abnormal findings: Secondary | ICD-10-CM | POA: Diagnosis not present

## 2020-02-28 LAB — HEMOCCULT GUIAC POC 1CARD (OFFICE): Fecal Occult Blood, POC: NEGATIVE

## 2020-02-28 NOTE — Progress Notes (Signed)
Patient ID: Kathleen Nelson, female   DOB: 02-May-1955, 65 y.o.   MRN: [id number] History of Present Illness: Kathleen Nelson is a 65 year old white female, married, PM in for a well woman gyn exam and pap. PCP is Dr Karilyn Cota.   Current Medications, Allergies, Past Medical History, Past Surgical History, Family History and Social History were reviewed in [employer] record.     Review of Systems: Patient denies any headaches, hearing loss, fatigue, blurred vision, shortness of breath, chest pain, abdominal pain, problems with bowel movements, urination, or intercourse. No joint pain or mood swings.    Physical Exam:BP 115/76 (BP Location: Left Arm, Patient Position: Sitting, Cuff Size: Normal)   Pulse 82   Ht 5\' 4"  (1.626 m)   Wt 131 lb (59.4 kg)   BMI 22.49 kg/m  General:  Well developed, well nourished, no acute distress Skin:  Warm and dry Neck:  Midline trachea, normal thyroid, good ROM, no lymphadenopathy,no carotid bruits heard Lungs; Clear to auscultation bilaterally Breast:  No dominant palpable mass, retraction, or nipple discharge Cardiovascular: Regular rate and rhythm Abdomen:  Soft, non tender, no hepatosplenomegaly Pelvic:  External genitalia is normal in appearance, no lesions.  The vagina is pale with loss of moisture and rugae. Urethra has no lesions or masses. The cervix is smooth, pap with HRHPV genotyping performed. Uterus is felt to be normal size, shape, and contour.  No adnexal masses or tenderness noted.Bladder is non tender, no masses felt. Rectal: Good sphincter tone, no polyps, or hemorrhoids felt.  Hemoccult negative. Extremities/musculoskeletal:  No swelling or varicosities noted, no clubbing or cyanosis Psych:  No mood changes, alert and cooperative,seems happy Reviewed labs and DEXA with pt.  AA is 2 Fall risk is high PHQ 9 score is 0 GAD 7 score is 0  Upstream - 02/28/20 1439      Pregnancy Intention Screening   Does the patient want  to become pregnant in the next year? N/A    Does the patient's partner want to become pregnant in the next year? N/A    Would the patient like to discuss contraceptive options today? N/A      Contraception Wrap Up   Current Method Female Sterilization    End Method Female Sterilization    Contraception Counseling Provided No         Examination chaperoned by 04/27/20 LPN   Impression and Plan: 1. Encounter for gynecological examination with Papanicolaou smear of cervix Pap sent Physical in 1 year Pap in 3 if normal Mammogram yearly Colonoscopy at 66  Dexa in 2 years Labs next year Gave handout on [employer], hereditary cancer screening  2. Encounter for screening fecal occult blood testing  3. Post-menopausal

## 2020-03-01 LAB — CYTOLOGY - PAP
Comment: NEGATIVE
Diagnosis: NEGATIVE
High risk HPV: NEGATIVE

## 2020-04-15 DIAGNOSIS — M25532 Pain in left wrist: Secondary | ICD-10-CM | POA: Diagnosis not present

## 2020-04-16 DIAGNOSIS — L570 Actinic keratosis: Secondary | ICD-10-CM | POA: Diagnosis not present

## 2020-04-16 DIAGNOSIS — D239 Other benign neoplasm of skin, unspecified: Secondary | ICD-10-CM | POA: Diagnosis not present

## 2020-12-09 DIAGNOSIS — H52223 Regular astigmatism, bilateral: Secondary | ICD-10-CM | POA: Diagnosis not present

## 2021-02-24 ENCOUNTER — Other Ambulatory Visit (HOSPITAL_COMMUNITY): Payer: Self-pay | Admitting: Adult Health

## 2021-02-24 DIAGNOSIS — Z1231 Encounter for screening mammogram for malignant neoplasm of breast: Secondary | ICD-10-CM

## 2021-02-27 ENCOUNTER — Ambulatory Visit (HOSPITAL_COMMUNITY)
Admission: RE | Admit: 2021-02-27 | Discharge: 2021-02-27 | Disposition: A | Discharge status: Home / Self Care | Payer: BC Managed Care – PPO | Source: Ambulatory Visit | Attending: Adult Health | Admitting: Adult Health

## 2021-02-27 ENCOUNTER — Ambulatory Visit (HOSPITAL_COMMUNITY): Payer: BC Managed Care – PPO

## 2021-02-27 ENCOUNTER — Other Ambulatory Visit: Payer: Self-pay

## 2021-02-27 DIAGNOSIS — Z1231 Encounter for screening mammogram for malignant neoplasm of breast: Secondary | ICD-10-CM | POA: Diagnosis not present

## 2021-02-28 ENCOUNTER — Ambulatory Visit (INDEPENDENT_AMBULATORY_CARE_PROVIDER_SITE_OTHER): Payer: BC Managed Care – PPO | Admitting: Adult Health

## 2021-02-28 ENCOUNTER — Encounter: Payer: Self-pay | Admitting: Adult Health

## 2021-02-28 VITALS — BP 116/69 | HR 74 | Ht 64.0 in | Wt 126.0 lb

## 2021-02-28 DIAGNOSIS — Z01419 Encounter for gynecological examination (general) (routine) without abnormal findings: Secondary | ICD-10-CM

## 2021-02-28 DIAGNOSIS — Z780 Asymptomatic menopausal state: Secondary | ICD-10-CM

## 2021-02-28 DIAGNOSIS — Z1211 Encounter for screening for malignant neoplasm of colon: Secondary | ICD-10-CM | POA: Diagnosis not present

## 2021-02-28 LAB — HEMOCCULT GUIAC POC 1CARD (OFFICE): Fecal Occult Blood, POC: NEGATIVE

## 2021-02-28 NOTE — Progress Notes (Signed)
Patient ID: Kathleen Nelson, female   DOB: Aug 15, 1955, 66 y.o.   MRN: [id number] History of Present Illness: [name] is a 66 year old white female, married, PM in for a well woman gyn exam. She is active.   Lab Results  Component Value Date   DIAGPAP  02/28/2020    - Negative for intraepithelial lesion or malignancy (NILM)   HPV NOT DETECTED 11/01/2017   Burt Negative 02/28/2020    Current Medications, Allergies, Past Medical History, Past Surgical History, Family History and Social History were reviewed in [employer] record.     Review of Systems:  Patient denies any headaches, hearing loss, fatigue, blurred vision, shortness of breath, chest pain, abdominal pain, problems with bowel movements, urination, or intercourse. No joint pain or mood swings.  Denies any vaginal bleeding.  Physical Exam:BP 116/69 (BP Location: Right Arm, Patient Position: Sitting, Cuff Size: Normal)    Pulse 74    Ht 5\' 4"  (1.626 m)    Wt 126 lb (57.2 kg)    BMI 21.63 kg/m   General:  Well developed, well nourished, no acute distress Skin:  Warm and dry Neck:  Midline trachea, normal thyroid, good ROM, no lymphadenopathy,no carotid bruits heard Lungs; Clear to auscultation bilaterally Breast:  No dominant palpable mass, retraction, or nipple discharge Cardiovascular: Regular rate and rhythm Abdomen:  Soft, non tender, no hepatosplenomegaly Pelvic:  External genitalia is normal in appearance, no lesions.  The vagina is pale and has loss of rugae. Urethra has no lesions or masses. The cervix is smooth.  Uterus is felt to be normal size, shape, and contour.  No adnexal masses or tenderness noted.Bladder is non tender, no masses felt. Rectal: Good sphincter tone, no polyps, or hemorrhoids felt.  Hemoccult negative. Extremities/musculoskeletal:  No swelling or varicosities noted, no clubbing or cyanosis Psych:  No mood changes, alert and cooperative,seems happy AA is 2 Fall risk is  low Depression screen [facility] 2/9 02/28/2021 02/28/2020 01/11/2019  Decreased Interest 0 0 0  Down, Depressed, Hopeless 0 0 0  PHQ - 2 Score 0 0 0  Altered sleeping 0 0 -  Tired, decreased energy 0 0 -  Change in appetite 0 0 -  Feeling bad or failure about yourself  0 0 -  Trouble concentrating 0 0 -  Moving slowly or fidgety/restless 0 0 -  Suicidal thoughts 0 0 -  PHQ-9 Score 0 0 -    GAD 7 : Generalized Anxiety Score 02/28/2021 02/28/2020  Nervous, Anxious, on Edge 0 0  Control/stop worrying 0 0  Worry too much - different things 0 0  Trouble relaxing 0 0  Restless 0 0  Easily annoyed or irritable 0 0  Afraid - awful might happen 0 0  Total GAD 7 Score 0 0    [facility] - 02/28/21 1122       Pregnancy Intention Screening   Does the patient want to become pregnant in the next year? N/A    Does the patient's partner want to become pregnant in the next year? N/A    Would the patient like to discuss contraceptive options today? N/A      Contraception Wrap Up   Current Method Female Sterilization    End Method Female Sterilization    Contraception Counseling Provided No              Examination chaperoned by Levy Pupa LPN  Impression and Plan: . 1. Encounter for well woman exam with routine gynecological  exam Physical in 1 year Pap in 2025 Mammogram yearly Dexa next year Labs next year Colonoscopy per GI   2. Encounter for screening fecal occult blood testing  3. Post-menopausal

## 2021-03-07 DIAGNOSIS — M79672 Pain in left foot: Secondary | ICD-10-CM | POA: Diagnosis not present

## 2021-03-07 DIAGNOSIS — M2022 Hallux rigidus, left foot: Secondary | ICD-10-CM | POA: Diagnosis not present

## 2021-03-07 DIAGNOSIS — M2021 Hallux rigidus, right foot: Secondary | ICD-10-CM | POA: Diagnosis not present

## 2021-03-07 DIAGNOSIS — M79671 Pain in right foot: Secondary | ICD-10-CM | POA: Diagnosis not present

## 2021-05-02 DIAGNOSIS — M79641 Pain in right hand: Secondary | ICD-10-CM | POA: Diagnosis not present

## 2021-05-05 DIAGNOSIS — L570 Actinic keratosis: Secondary | ICD-10-CM | POA: Diagnosis not present

## 2021-05-15 DIAGNOSIS — S62111A Displaced fracture of triquetrum [cuneiform] bone, right wrist, initial encounter for closed fracture: Secondary | ICD-10-CM | POA: Diagnosis not present

## 2022-01-28 ENCOUNTER — Other Ambulatory Visit (HOSPITAL_COMMUNITY): Payer: Self-pay | Admitting: Adult Health

## 2022-01-28 DIAGNOSIS — Z1231 Encounter for screening mammogram for malignant neoplasm of breast: Secondary | ICD-10-CM

## 2022-03-02 ENCOUNTER — Ambulatory Visit (HOSPITAL_COMMUNITY)
Admission: RE | Admit: 2022-03-02 | Discharge: 2022-03-02 | Disposition: A | Discharge status: Home / Self Care | Payer: Medicare Other | Source: Ambulatory Visit | Attending: Adult Health | Admitting: Adult Health

## 2022-03-02 DIAGNOSIS — Z1231 Encounter for screening mammogram for malignant neoplasm of breast: Secondary | ICD-10-CM

## 2022-03-11 ENCOUNTER — Ambulatory Visit: Payer: Medicare Other | Admitting: Adult Health

## 2022-03-11 ENCOUNTER — Encounter: Payer: Self-pay | Admitting: Adult Health

## 2022-03-11 VITALS — BP 112/66 | HR 77 | Ht 64.0 in | Wt 130.0 lb

## 2022-03-11 DIAGNOSIS — Z0000 Encounter for general adult medical examination without abnormal findings: Secondary | ICD-10-CM

## 2022-03-11 DIAGNOSIS — Z780 Asymptomatic menopausal state: Secondary | ICD-10-CM | POA: Diagnosis not present

## 2022-03-11 DIAGNOSIS — Z13220 Encounter for screening for lipoid disorders: Secondary | ICD-10-CM | POA: Diagnosis not present

## 2022-03-11 DIAGNOSIS — Z01419 Encounter for gynecological examination (general) (routine) without abnormal findings: Secondary | ICD-10-CM | POA: Diagnosis not present

## 2022-03-11 DIAGNOSIS — Z1211 Encounter for screening for malignant neoplasm of colon: Secondary | ICD-10-CM

## 2022-03-11 LAB — HEMOCCULT GUIAC POC 1CARD (OFFICE): Fecal Occult Blood, POC: NEGATIVE

## 2022-03-11 NOTE — Progress Notes (Signed)
Patient ID: Kathleen Nelson, female   DOB: 23-Feb-1955, 67 y.o.   MRN: [id number] History of Present Illness: Kathleen Nelson is a 67 year old white female,married, PM, in for well woman gyn exam.  Last pap was negative for HPV and NILM 02/28/20.   Current Medications, Allergies, Past Medical History, Past Surgical History, Family History and Social History were reviewed in [employer] record.     Review of Systems: Patient denies any daily headaches(has occasional), hearing loss, fatigue, blurred vision, shortness of breath, chest pain, abdominal pain, problems with bowel movements, urination, or intercourse. No joint pain or mood swings.  She denies any vaginal bleeding.   Physical Exam:BP 112/66 (BP Location: Left Arm, Patient Position: Sitting, Cuff Size: Normal)   Pulse 77   Ht 5' 4"$  (1.626 m)   Wt 130 lb (59 kg)   BMI 22.31 kg/m   General:  Well developed, well nourished, no acute distress Skin:  Warm and dry Neck:  Midline trachea, normal thyroid, good ROM, no lymphadenopathy,no carotid bruits heard  Lungs; Clear to auscultation bilaterally Breast:  No dominant palpable mass, retraction, or nipple discharge Cardiovascular: Regular rate and rhythm Abdomen:  Soft, non tender, no hepatosplenomegaly Pelvic:  External genitalia is normal in appearance, no lesions.  The vagina is pale, with loss of rugae. Urethra has no lesions or masses. The cervix is smooth.  Uterus is felt to be normal size, shape, and contour.  No adnexal masses or tenderness noted.Bladder is non tender, no masses felt. Rectal: Good sphincter tone, no polyps, or hemorrhoids felt.  Hemoccult negative. Extremities/musculoskeletal:  No swelling or varicosities noted, no clubbing or cyanosis Psych:  No mood changes, alert and cooperative,seems happy AA is 2  Fall risk is low    03/11/2022    1:32 PM 02/28/2021   11:23 AM 02/28/2020    2:42 PM  Depression screen PHQ 2/9  Decreased Interest 0 0 0  Down,  Depressed, Hopeless 0 0 0  PHQ - 2 Score 0 0 0  Altered sleeping 0 0 0  Tired, decreased energy 0 0 0  Change in appetite 0 0 0  Feeling bad or failure about yourself  0 0 0  Trouble concentrating 0 0 0  Moving slowly or fidgety/restless 0 0 0  Suicidal thoughts 0 0 0  PHQ-9 Score 0 0 0       03/11/2022    1:33 PM 02/28/2021   11:23 AM 02/28/2020    2:42 PM  GAD 7 : Generalized Anxiety Score  Nervous, Anxious, on Edge 0 0 0  Control/stop worrying 0 0 0  Worry too much - different things 0 0 0  Trouble relaxing 0 0 0  Restless 0 0 0  Easily annoyed or irritable 0 0 0  Afraid - awful might happen 0 0 0  Total GAD 7 Score 0 0 0   Examination chaperoned by Alvera Singh NP student.    Impression and plan: 1. Encounter for well woman exam with routine gynecological exam Pap and physical in 2 years  Will check labs Had negative mammogram 03/02/22 Colonoscopy per GI - CBC - Comprehensive metabolic panel  2. Encounter for screening fecal occult blood testing Hemoccult was negative   3. Post-menopausal Denies any bleeding   4. Screening cholesterol level - Lipid panel

## 2022-05-20 IMAGING — MG MM DIGITAL SCREENING BILAT W/ TOMO AND CAD
8 series · 9 of 24 positions shown · non-contrast
Comparison: Previous exam(s).

CLINICAL DATA: Screening.

EXAM:
DIGITAL SCREENING BILATERAL MAMMOGRAM WITH TOMO AND CAD

[R MLO synth-2D]
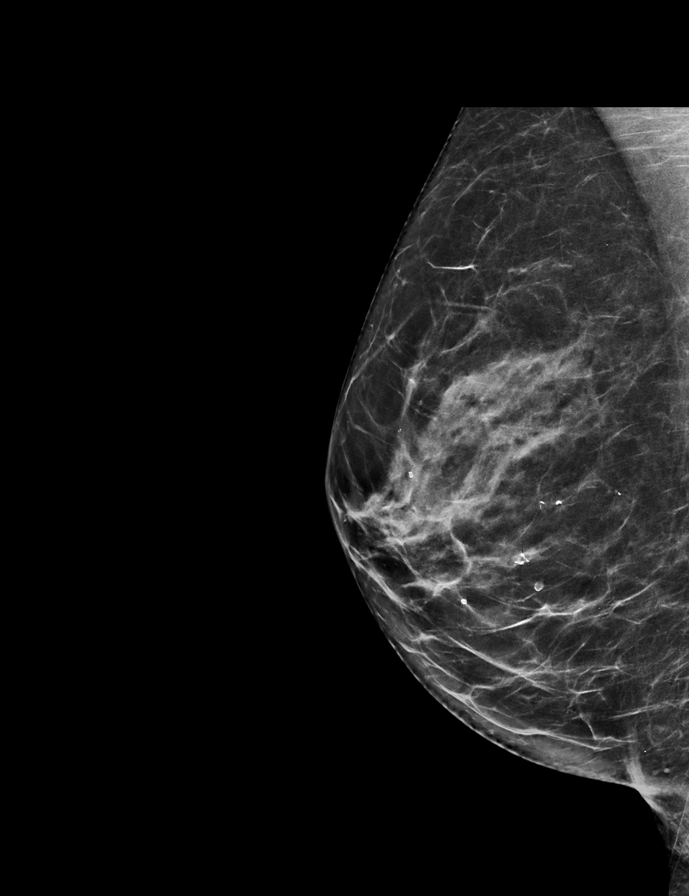

[L MLO synth-2D]
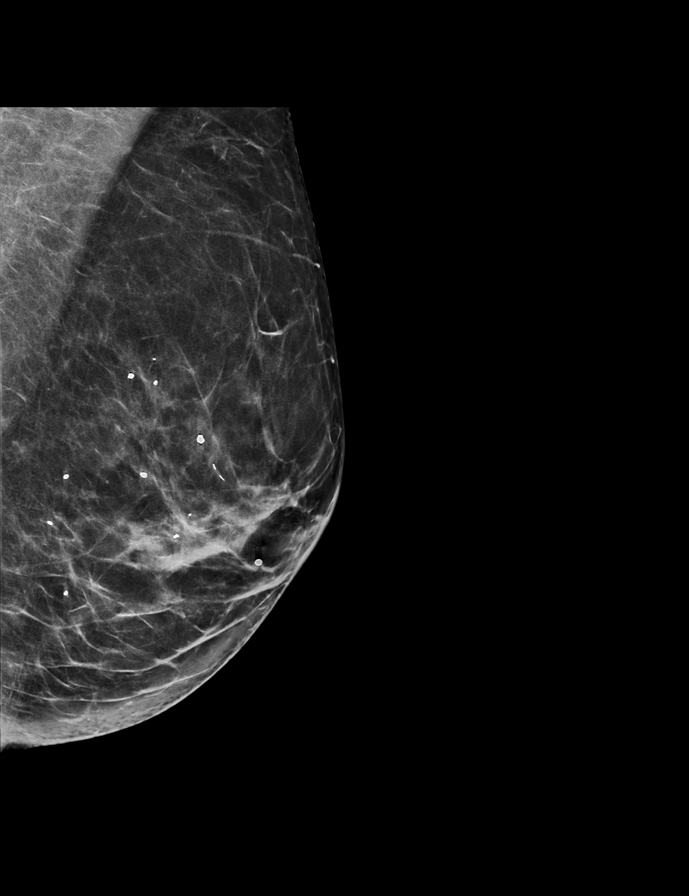

[L CC synth-2D]
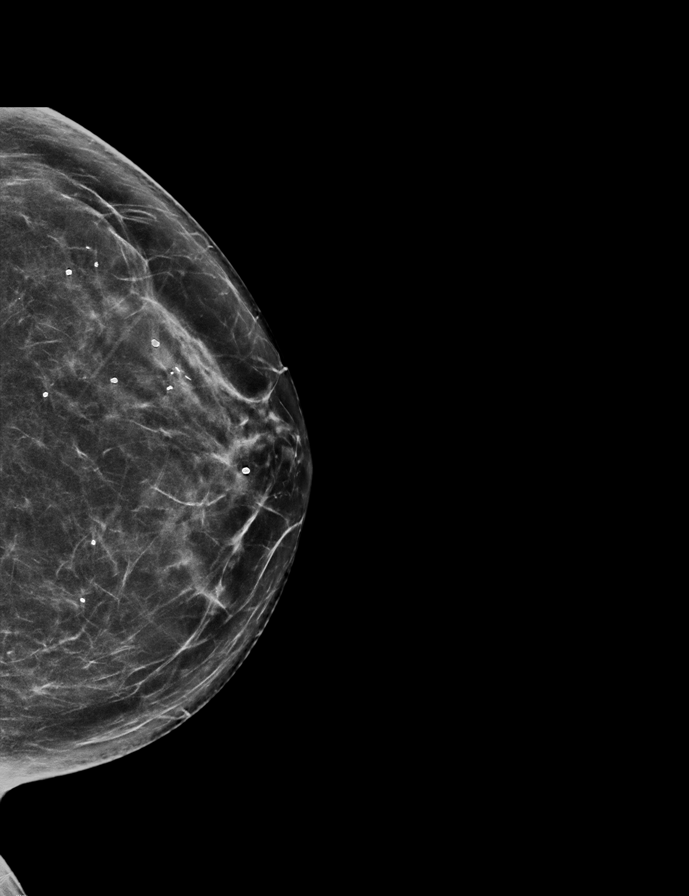

[R CC synth-2D]
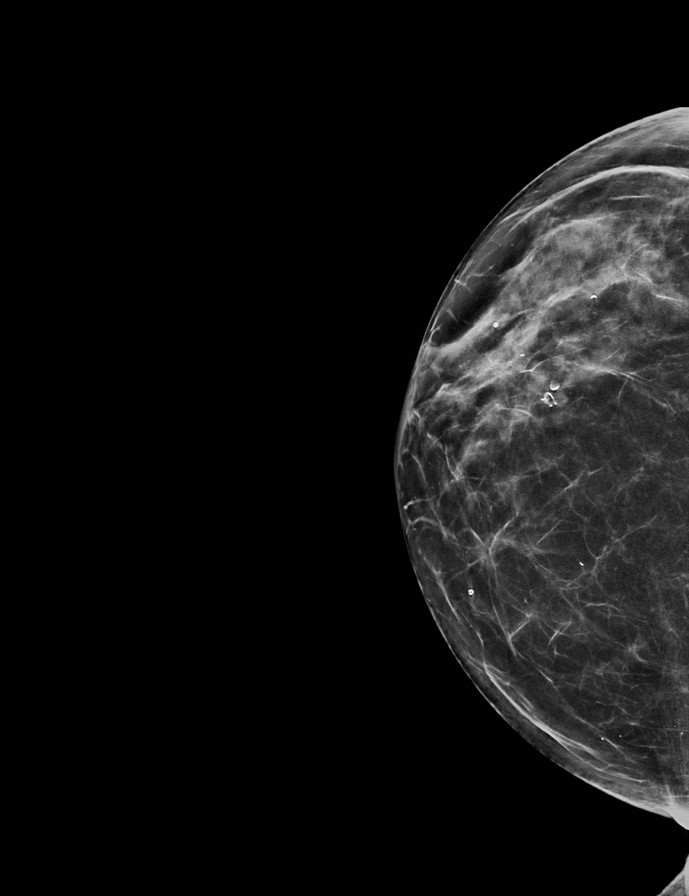

[R CC tomo · 2 of 66 frames shown]
[frame 22/66]
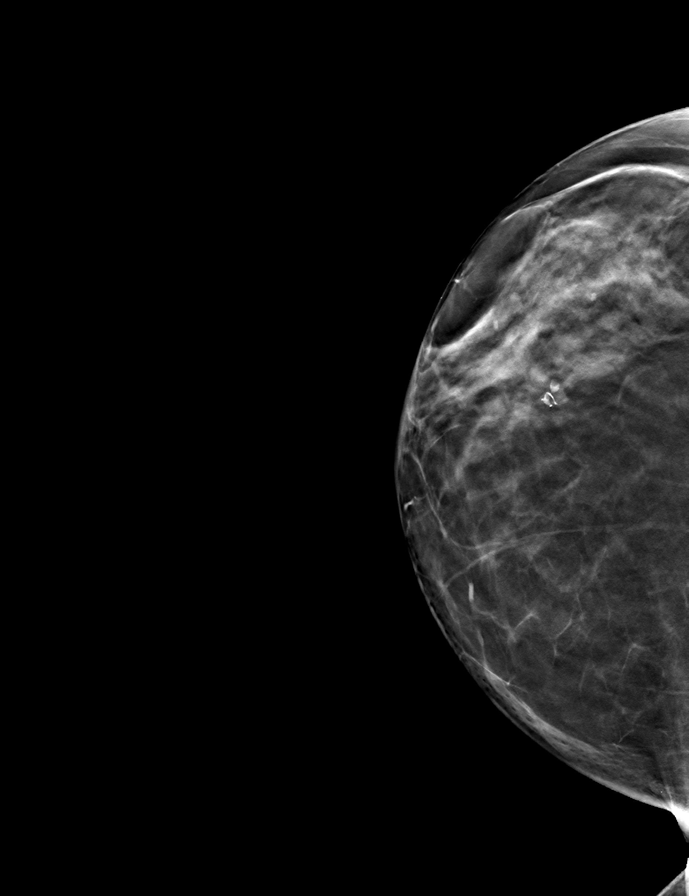
[frame 33/66]
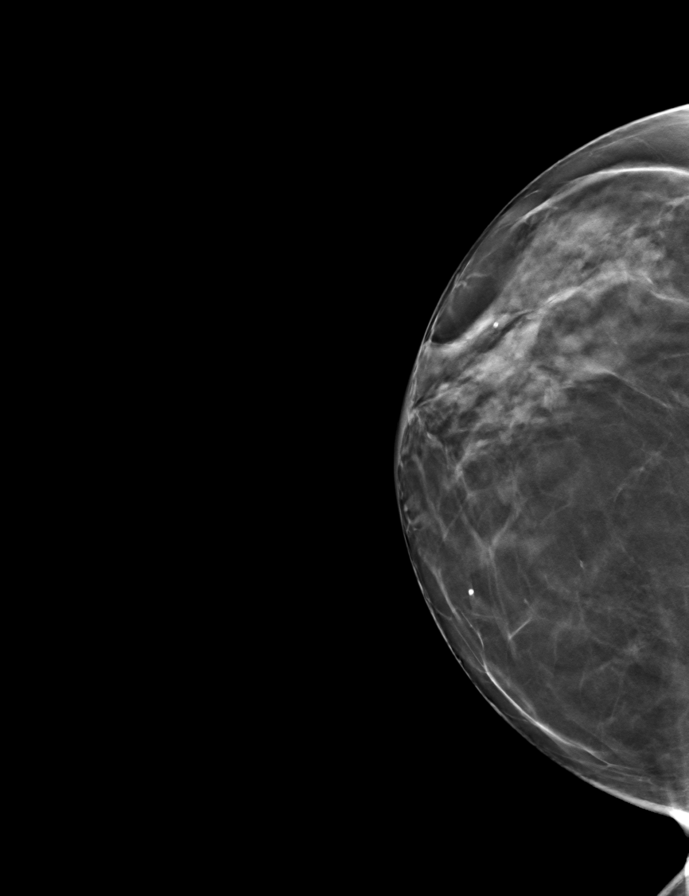

[L MLO tomo · tomo slice 35/69.0]
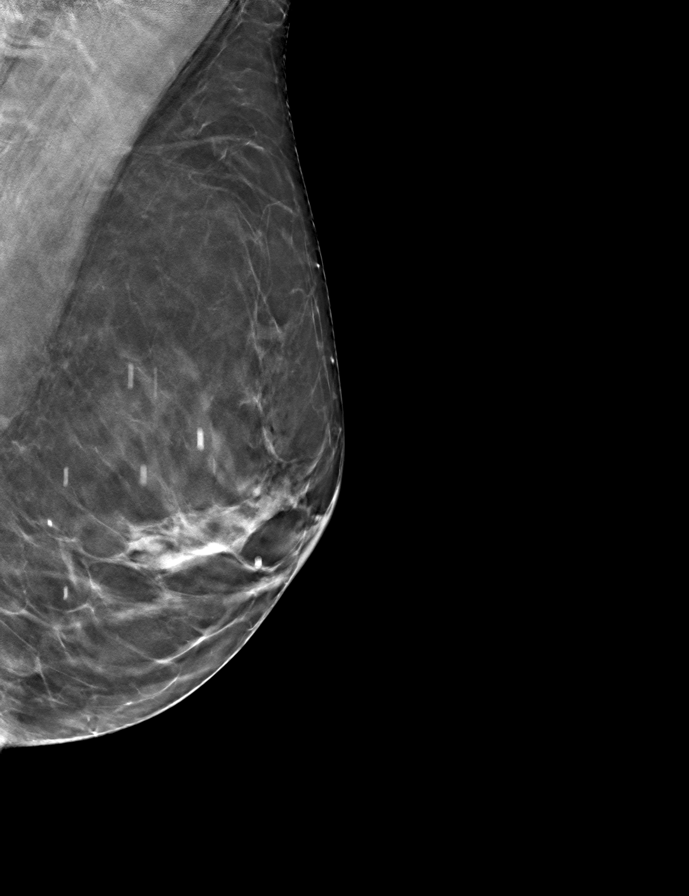

[R MLO tomo · tomo slice 35/69.0]
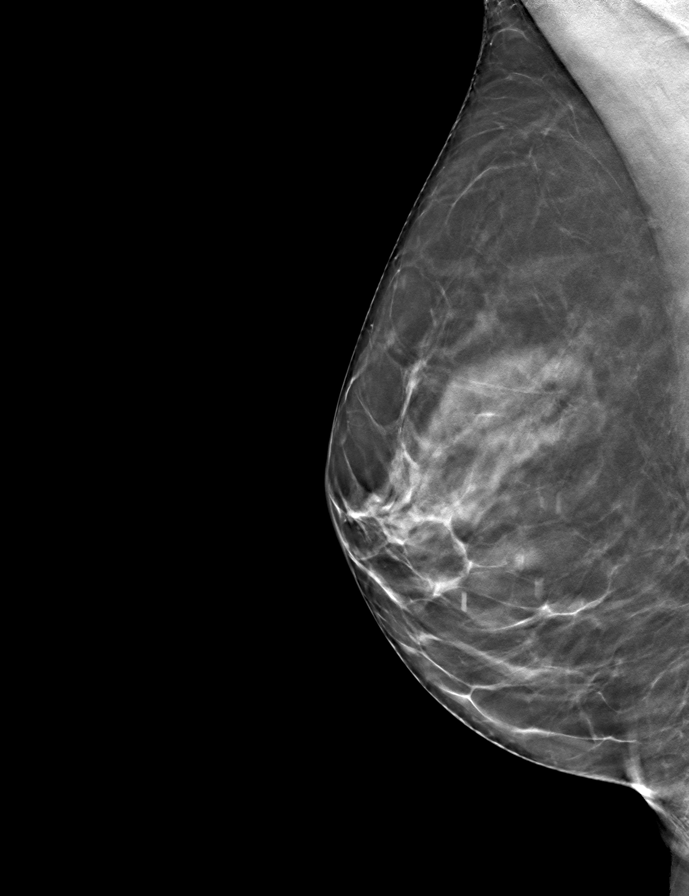

[L CC tomo · tomo slice 37/72.0]
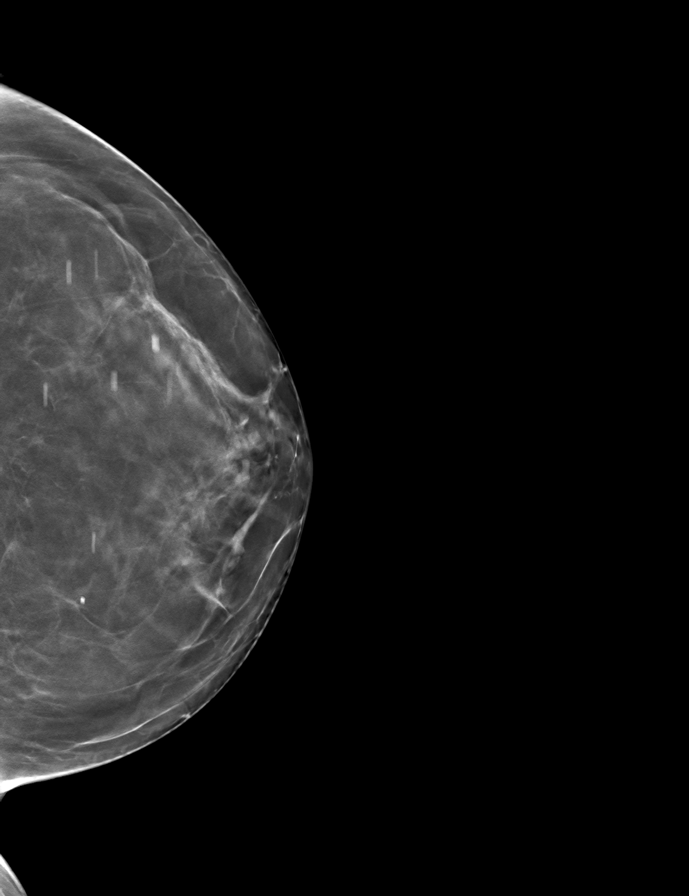

[9 of 24 positions shown; findings below may reference images not displayed]

ACR Breast Density Category b: There are scattered areas of
fibroglandular density.
FINDINGS: There are no findings suspicious for malignancy. The images were
evaluated with computer-aided detection.
IMPRESSION: No mammographic evidence of malignancy. A result letter of this
screening mammogram will be mailed directly to the patient.

RECOMMENDATION:
Screening mammogram in one year. (Code:ZP-7-VX7)

BI-RADS CATEGORY  1: Negative.

## 2022-06-06 ENCOUNTER — Emergency Department (HOSPITAL_COMMUNITY): Payer: Medicare Other

## 2022-06-06 ENCOUNTER — Emergency Department (HOSPITAL_COMMUNITY)
Admission: EM | Admit: 2022-06-06 | Discharge: 2022-06-06 | Disposition: A | Discharge status: Home / Self Care | Payer: Medicare Other | Attending: Emergency Medicine | Admitting: Emergency Medicine

## 2022-06-06 ENCOUNTER — Encounter (HOSPITAL_COMMUNITY): Payer: Self-pay

## 2022-06-06 ENCOUNTER — Other Ambulatory Visit: Payer: Self-pay

## 2022-06-06 DIAGNOSIS — Z23 Encounter for immunization: Secondary | ICD-10-CM | POA: Insufficient documentation

## 2022-06-06 DIAGNOSIS — W5911XA Bitten by nonvenomous snake, initial encounter: Secondary | ICD-10-CM

## 2022-06-06 DIAGNOSIS — T63001A Toxic effect of unspecified snake venom, accidental (unintentional), initial encounter: Secondary | ICD-10-CM | POA: Diagnosis not present

## 2022-06-06 LAB — CBC WITH DIFFERENTIAL/PLATELET
Abs Immature Granulocytes: 0.02 10*3/uL (ref 0.00–0.07)
Abs Immature Granulocytes: 0.02 10*3/uL (ref 0.00–0.07)
Basophils Absolute: 0 10*3/uL (ref 0.0–0.1)
Basophils Absolute: 0 10*3/uL (ref 0.0–0.1)
Basophils Relative: 0 %
Basophils Relative: 0 %
Eosinophils Absolute: 0 10*3/uL (ref 0.0–0.5)
Eosinophils Absolute: 0 10*3/uL (ref 0.0–0.5)
Eosinophils Relative: 0 %
Eosinophils Relative: 0 %
HCT: 43.7 % (ref 36.0–46.0)
HCT: 40.9 % (ref 36.0–46.0)
Hemoglobin: 14.4 g/dL (ref 12.0–15.0)
Hemoglobin: 13.1 g/dL (ref 12.0–15.0)
Immature Granulocytes: 0 %
Immature Granulocytes: 0 %
Lymphocytes Relative: 16 %
Lymphocytes Relative: 27 %
Lymphs Abs: 1.1 10*3/uL (ref 0.7–4.0)
Lymphs Abs: 1.9 10*3/uL (ref 0.7–4.0)
MCH: 30.6 pg (ref 26.0–34.0)
MCH: 30.1 pg (ref 26.0–34.0)
MCHC: 33 g/dL (ref 30.0–36.0)
MCHC: 32 g/dL (ref 30.0–36.0)
MCV: 92.8 fL (ref 80.0–100.0)
MCV: 94 fL (ref 80.0–100.0)
Monocytes Absolute: 0.3 10*3/uL (ref 0.1–1.0)
Monocytes Absolute: 0.3 10*3/uL (ref 0.1–1.0)
Monocytes Relative: 4 %
Monocytes Relative: 5 %
Neutro Abs: 5.7 10*3/uL (ref 1.7–7.7)
Neutro Abs: 4.7 10*3/uL (ref 1.7–7.7)
Neutrophils Relative %: 80 %
Neutrophils Relative %: 68 %
Platelets: 206 10*3/uL (ref 150–400)
Platelets: 188 10*3/uL (ref 150–400)
RBC: 4.71 MIL/uL (ref 3.87–5.11)
RBC: 4.35 MIL/uL (ref 3.87–5.11)
RDW: 12.4 % (ref 11.5–15.5)
RDW: 12.4 % (ref 11.5–15.5)
WBC: 7.1 10*3/uL (ref 4.0–10.5)
WBC: 7 10*3/uL (ref 4.0–10.5)
nRBC: 0 % (ref 0.0–0.2)
nRBC: 0 % (ref 0.0–0.2)

## 2022-06-06 LAB — PROTIME-INR
INR: 1 (ref 0.8–1.2)
INR: 1.1 (ref 0.8–1.2)
Prothrombin Time: 13.7 seconds (ref 11.4–15.2)
Prothrombin Time: 14.6 seconds (ref 11.4–15.2)

## 2022-06-06 LAB — FIBRINOGEN
Fibrinogen: 351 mg/dL (ref 210–475)
Fibrinogen: 323 mg/dL (ref 210–475)

## 2022-06-06 LAB — BASIC METABOLIC PANEL
Anion gap: 8 (ref 5–15)
BUN: 27 mg/dL — ABNORMAL HIGH (ref 8–23)
CO2: 25 mmol/L (ref 22–32)
Calcium: 9.2 mg/dL (ref 8.9–10.3)
Chloride: 106 mmol/L (ref 98–111)
Creatinine, Ser: 0.85 mg/dL (ref 0.44–1.00)
GFR, Estimated: >60 mL/min (ref >60)
Glucose, Bld: 93 mg/dL (ref 70–99)
Potassium: 3.7 mmol/L (ref 3.5–5.1)
Sodium: 139 mmol/L (ref 135–145)

## 2022-06-06 MED ORDER — MORPHINE SULFATE (PF) 2 MG/ML IV SOLN
2.0000 mg | Freq: Once | INTRAVENOUS | Status: AC
  Administered 2022-06-06: 2 mg via INTRAVENOUS
  Filled 2022-06-06: qty 1

## 2022-06-06 MED ORDER — ONDANSETRON HCL 4 MG/2ML IJ SOLN
4.0000 mg | Freq: Once | INTRAMUSCULAR | Status: AC
  Administered 2022-06-06: 4 mg via INTRAVENOUS
  Filled 2022-06-06: qty 2

## 2022-06-06 MED ORDER — MORPHINE SULFATE (PF) 4 MG/ML IV SOLN
4.0000 mg | Freq: Once | INTRAVENOUS | Status: AC
  Administered 2022-06-06: 4 mg via INTRAVENOUS
  Filled 2022-06-06: qty 1

## 2022-06-06 MED ORDER — OXYCODONE HCL 5 MG PO TABS: 5.0000 mg | ORAL_TABLET | Freq: Four times a day (QID) | ORAL | 0 refills | Status: AC | PRN

## 2022-06-06 MED ORDER — OXYCODONE HCL 5 MG PO TABS
5.0000 mg | ORAL_TABLET | Freq: Once | ORAL | Status: AC
  Administered 2022-06-06: 5 mg via ORAL
  Filled 2022-06-06: qty 1

## 2022-06-06 MED ORDER — SODIUM CHLORIDE 0.9 % IV SOLN: INTRAVENOUS | Status: DC

## 2022-06-06 MED ORDER — TETANUS-DIPHTH-ACELL PERTUSSIS 5-2.5-18.5 LF-MCG/0.5 IM SUSY
0.5000 mL | PREFILLED_SYRINGE | Freq: Once | INTRAMUSCULAR | Status: AC
  Administered 2022-06-06: 0.5 mL via INTRAMUSCULAR
  Filled 2022-06-06: qty 0.5

## 2022-06-06 MED ORDER — MORPHINE SULFATE (PF) 4 MG/ML IV SOLN: 4.0000 mg | Freq: Once | INTRAVENOUS | Status: DC

## 2022-06-06 NOTE — Discharge Instructions (Signed)
Return for any problem.  ?

## 2022-06-06 NOTE — ED Triage Notes (Signed)
Pt was bit by a copperhead snake on her right outer foot two hours ago. Pt has 2+ swelling of right outer ankle. Per pt's husband the snake had a lizard in it's mouth and it was bit with one fang. Pt is nauseated. Pt is having pain in right ankle and toes. Pt denies any other sx.

## 2022-06-06 NOTE — ED Provider Notes (Signed)
[place] EMERGENCY DEPARTMENT AT [facility] Provider Note   CSN: [id number] Arrival date & time: 06/06/22  1435     History  Chief Complaint  Patient presents with   Snake Bite    Kathleen Nelson is a 67 y.o. female.  67 year old female with prior medical history as detailed below presents for evaluation.  Patient with likely right ankle copperhead snake bite.  Patient reports snakebite occurred approximately 2 hours prior to arrival.  Patient reports that the snake bit her on the right lateral malleolus of her right ankle.  Patient's husband killed the snake into the pictures.  The snake is clearly a copperhead.  Patient complains of minimal pain to the right ankle and minimal swelling to the lateral aspect of the right ankle.  She is otherwise without complaint.  She is unsure of her last tetanus.    The history is provided by the patient and medical records.       Home Medications Prior to Admission medications   Medication Sig Start Date End Date Taking? Authorizing Provider  oxyCODONE (ROXICODONE) 5 MG immediate release tablet Take 1 tablet (5 mg total) by mouth every 6 (six) hours as needed for severe pain. 06/06/22  Yes Wynetta Fines, MD  Ascorbic Acid (VITAMIN C PO) Take by mouth.    [provider]  Calcium-Magnesium-Vitamin D (CALCIUM 1200+D3 PO) Take by mouth.    [provider]  Cholecalciferol (VITAMIN D3 PO) Take by mouth.    [provider]  Cyanocobalamin (VITAMIN B-12 PO) Take by mouth.    [provider]  Multiple Vitamins-Minerals (ZINC PO) Take by mouth.    [provider]  Nutritional Supplements (PYCNOGENOL) 30 MG CAPS Take 300 mg by mouth daily.    [provider]      Allergies    Codeine    Review of Systems   Review of Systems  All other systems reviewed and are negative.   Physical Exam Updated Vital Signs BP 136/79   Pulse 71   Temp 99 F (37.2 C) (Oral)   Resp 16    Ht 5\' 4"  (1.626 m)   Wt 59 kg   SpO2 100%   BMI 22.33 kg/m  Physical Exam Vitals and nursing note reviewed.  Constitutional:      General: She is not in acute distress.    Appearance: Normal appearance. She is well-developed.  HENT:     Head: Normocephalic and atraumatic.  Eyes:     Conjunctiva/sclera: Conjunctivae normal.     Pupils: Pupils are equal, round, and reactive to light.  Cardiovascular:     Rate and Rhythm: Normal rate and regular rhythm.     Heart sounds: Normal heart sounds.  Pulmonary:     Effort: Pulmonary effort is normal. No respiratory distress.     Breath sounds: Normal breath sounds.  Abdominal:     General: There is no distension.     Palpations: Abdomen is soft.     Tenderness: There is no abdominal tenderness.  Musculoskeletal:        General: No deformity. Normal range of motion.     Cervical back: Normal range of motion and neck supple.  Skin:    General: Skin is warm and dry.     Comments: Likely snakebite to the right ankle lateral malleolus.  Single fang mark noted.  Suspect partial envenomation given degree of edema around the malleolus.  See images below.  Note -images are separated by  several hours during ED observation.  Patient without significant increase or progression of edema or tenderness during the 6 hours of ED observation.  Neurological:     General: No focal deficit present.     Mental Status: She is alert and oriented to person, place, and time.        ED Results / Procedures / Treatments   Labs (all labs ordered are listed, but only abnormal results are displayed) Labs Reviewed  BASIC METABOLIC PANEL - Abnormal; Notable for the following components:      Result Value   BUN 27 (*)    All other components within normal limits  CBC WITH DIFFERENTIAL/PLATELET  CBC WITH DIFFERENTIAL/PLATELET  PROTIME-INR  PROTIME-INR  FIBRINOGEN  FIBRINOGEN  CBC WITH DIFFERENTIAL/PLATELET  PROTIME-INR  FIBRINOGEN     EKG None  Radiology DG Ankle 2 Views Right  Result Date: 06/06/2022 CLINICAL DATA:  Left ankle pain and swelling.  Snake bite EXAM: RIGHT ANKLE - 2 VIEW COMPARISON:  None Available. FINDINGS: No acute fracture. No dislocation. There is suggestion of a large tibiotalar joint effusion. Moderate soft tissue swelling at the lateral aspect of the ankle which extends approximately 14 cm above the level of the ankle joint within the lateral lower leg soft tissues. No radiopaque foreign body or soft tissue gas. No erosion or periosteal elevation. IMPRESSION: 1. Moderate soft tissue swelling at the lateral aspect of the ankle extending superiorly into the lower leg. Correlate for cellulitis. 2. Large tibiotalar joint effusion. Septic arthritis not excluded in the setting of infection. 3. No acute bony abnormality. Electronically Signed   By: Duanne Guess D.O.   On: 06/06/2022 15:26    Procedures Procedures    Medications Ordered in ED Medications  0.9 %  sodium chloride infusion ( Intravenous New Bag/Given 06/06/22 1558)  oxyCODONE (Oxy IR/ROXICODONE) immediate release tablet 5 mg (has no administration in time range)  Tdap (BOOSTRIX) injection 0.5 mL (0.5 mLs Intramuscular Given 06/06/22 1547)  ondansetron (ZOFRAN) injection 4 mg (4 mg Intravenous Given 06/06/22 1549)  morphine (PF) 2 MG/ML injection 2 mg (2 mg Intravenous Given 06/06/22 1549)  morphine (PF) 4 MG/ML injection 4 mg (4 mg Intravenous Given 06/06/22 1755)    ED Course/ Medical Decision Making/ A&P                             Medical Decision Making Amount and/or Complexity of Data Reviewed Labs: ordered. Radiology: ordered.  Risk Prescription drug management.    Medical Screen Complete  This patient presented to the ED with complaint of copperhead snake bite to right ankle.  This complaint involves an extensive number of treatment options. The initial differential diagnosis includes, but is not limited to,  copperhead snake bite to right ankle  This presentation is: Acute, Self-Limited, Previously Undiagnosed, Uncertain Prognosis, Complicated, Systemic Symptoms, and Threat to Life/Bodily Function  Patient with copperhead snake bite to right ankle.  This occurred 2 prior to prior to arrival.  Patient with tenderness and edema to the right lateral malleolus and right posterior calf.  During 6 hours of observation, patient's pain was well-controlled.  Patient required minimal narcotics for pain control.  Patient's foot was elevated.  Ice was not applied.  Patient did not have progression of her edema or worsening of her pain during observation.  Screening labs obtained including fibrinogen and coagulation profile was without significant change.  Patient and patient's husband educated regarding indications for  and use of CroFab.  Patient without clear indication of CroFab given lack of progression of edema near the bite itself, lack of lab abnormalities, lack of uncontrolled pain.  Patient offered overnight observation regardless of CroFab administration.  Patient would prefer to go home.  Patient's case reviewed with poison control and [place] snakebite protocol followed.  Patient without progression of edema.  Snakebite envenomation score never exceeded 3.  Pain was well-controlled throughout ED evaluation.  Labs were without significant abnormality.  Importance of close follow-up is stressed.  Strict return precautions given and understood.    Additional history obtained:  Additional history obtained from [facility] External records from outside sources obtained and reviewed including prior ED visits and prior Inpatient records.    Lab Tests:  I ordered and personally interpreted labs.  The pertinent results include: CBC, BMP, fibrinogen, INR   Imaging Studies ordered:  I ordered imaging studies including right ankle I independently visualized and interpreted obtained imaging which  showed edema, no fracture no foreign body I agree with the radiologist interpretation.   Cardiac Monitoring:  The patient was maintained on a cardiac monitor.  I personally viewed and interpreted the cardiac monitor which showed an underlying rhythm of: NSR   Medicines ordered:  I ordered medication including morphine, oxycodone, Zofran for pain, nausea Reevaluation of the patient after these medicines showed that the patient: improved   Problem List / ED Course:  Copperhead snake bite   Reevaluation:  After the interventions noted above, I reevaluated the patient and found that they have: improved   Disposition:  After consideration of the diagnostic results and the patients response to treatment, I feel that the patent would benefit from close outpatient follow-up.          Final Clinical Impression(s) / ED Diagnoses Final diagnoses:  Snake bite, initial encounter    Rx / DC Orders ED Discharge Orders          Ordered    oxyCODONE (ROXICODONE) 5 MG immediate release tablet  Every 6 hours PRN        06/06/22 2058              Wynetta Fines, MD 06/06/22 2142

## 2022-06-06 NOTE — ED Notes (Signed)
Right leg elevated.

## 2022-06-06 NOTE — ED Notes (Signed)
Poison control called, recommended 8 hours observation from the time of the bite.  Recommendation guideline faxed and given to the ED provider.

## 2023-02-08 ENCOUNTER — Other Ambulatory Visit: Payer: Self-pay | Admitting: Adult Health

## 2023-02-09 ENCOUNTER — Telehealth: Payer: Self-pay | Admitting: Adult Health

## 2023-02-09 MED ORDER — HYDROCORT-PRAMOXINE (PERIANAL) 2.5-1 % EX CREA: 1.0000 | TOPICAL_CREAM | Freq: Three times a day (TID) | CUTANEOUS | 1 refills | Status: AC

## 2023-02-09 NOTE — Telephone Encounter (Signed)
Hemorrhoids are external will rx analpram HC

## 2023-04-08 ENCOUNTER — Other Ambulatory Visit (HOSPITAL_COMMUNITY): Payer: Self-pay | Admitting: Family Medicine

## 2023-04-08 DIAGNOSIS — H93A2 Pulsatile tinnitus, left ear: Secondary | ICD-10-CM

## 2023-04-08 DIAGNOSIS — M810 Age-related osteoporosis without current pathological fracture: Secondary | ICD-10-CM

## 2023-04-12 ENCOUNTER — Ambulatory Visit (HOSPITAL_COMMUNITY)
Admission: RE | Admit: 2023-04-12 | Discharge: 2023-04-12 | Disposition: A | Discharge status: Home / Self Care | Source: Ambulatory Visit | Attending: Family Medicine | Admitting: Family Medicine

## 2023-04-12 DIAGNOSIS — M810 Age-related osteoporosis without current pathological fracture: Secondary | ICD-10-CM | POA: Insufficient documentation

## 2023-04-21 ENCOUNTER — Other Ambulatory Visit (HOSPITAL_COMMUNITY): Payer: Self-pay | Admitting: Adult Health

## 2023-04-21 DIAGNOSIS — Z1231 Encounter for screening mammogram for malignant neoplasm of breast: Secondary | ICD-10-CM

## 2023-04-26 ENCOUNTER — Ambulatory Visit (HOSPITAL_COMMUNITY)
Admission: RE | Admit: 2023-04-26 | Discharge: 2023-04-26 | Disposition: A | Discharge status: Home / Self Care | Source: Ambulatory Visit | Attending: Adult Health | Admitting: Adult Health

## 2023-04-26 DIAGNOSIS — Z1231 Encounter for screening mammogram for malignant neoplasm of breast: Secondary | ICD-10-CM | POA: Insufficient documentation

## 2023-05-13 ENCOUNTER — Ambulatory Visit (HOSPITAL_COMMUNITY)
Admission: RE | Admit: 2023-05-13 | Discharge: 2023-05-13 | Disposition: A | Discharge status: Home / Self Care | Source: Ambulatory Visit | Attending: Family Medicine | Admitting: Family Medicine

## 2023-05-13 DIAGNOSIS — H93A2 Pulsatile tinnitus, left ear: Secondary | ICD-10-CM | POA: Diagnosis present

## 2023-05-13 MED ORDER — IOHEXOL 350 MG/ML SOLN
75.0000 mL | Freq: Once | INTRAVENOUS | Status: AC | PRN
  Administered 2023-05-13: 75 mL via INTRAVENOUS
